# Patient Record
Sex: Female | Born: 1945 | Race: White | Hispanic: No | Marital: Married | Smoking: Never smoker
Health system: Southern US, Community
[De-identification: ages and names within clinical notes are randomized; demographics above are authoritative.]

## PROBLEM LIST (undated history)

## (undated) DIAGNOSIS — E785 Hyperlipidemia, unspecified: Secondary | ICD-10-CM

## (undated) DIAGNOSIS — Z923 Personal history of irradiation: Secondary | ICD-10-CM

## (undated) HISTORY — DX: Hyperlipidemia, unspecified: E78.5

---

## 1996-12-28 HISTORY — PX: BREAST EXCISIONAL BIOPSY: SUR124

## 1998-10-25 ENCOUNTER — Other Ambulatory Visit: Admission: RE | Admit: 1998-10-25 | Discharge: 1998-10-25 | Payer: Self-pay | Admitting: Obstetrics and Gynecology

## 1999-10-07 ENCOUNTER — Other Ambulatory Visit: Admission: RE | Admit: 1999-10-07 | Discharge: 1999-10-07 | Payer: Self-pay | Admitting: Obstetrics and Gynecology

## 2000-10-18 ENCOUNTER — Other Ambulatory Visit: Admission: RE | Admit: 2000-10-18 | Discharge: 2000-10-18 | Payer: Self-pay | Admitting: Obstetrics and Gynecology

## 2001-07-04 ENCOUNTER — Ambulatory Visit (HOSPITAL_COMMUNITY): Admission: RE | Admit: 2001-07-04 | Discharge: 2001-07-04 | Payer: Self-pay | Admitting: Gastroenterology

## 2001-12-19 ENCOUNTER — Other Ambulatory Visit: Admission: RE | Admit: 2001-12-19 | Discharge: 2001-12-19 | Payer: Self-pay | Admitting: Obstetrics and Gynecology

## 2003-05-24 ENCOUNTER — Other Ambulatory Visit: Admission: RE | Admit: 2003-05-24 | Discharge: 2003-05-24 | Payer: Self-pay | Admitting: Obstetrics and Gynecology

## 2004-09-16 ENCOUNTER — Encounter: Admission: RE | Admit: 2004-09-16 | Discharge: 2004-09-16 | Payer: Self-pay | Admitting: Family Medicine

## 2004-12-28 DIAGNOSIS — Z923 Personal history of irradiation: Secondary | ICD-10-CM

## 2004-12-28 HISTORY — DX: Personal history of irradiation: Z92.3

## 2005-09-25 ENCOUNTER — Other Ambulatory Visit: Admission: RE | Admit: 2005-09-25 | Discharge: 2005-09-25 | Payer: Self-pay | Admitting: Family Medicine

## 2005-10-16 ENCOUNTER — Encounter: Admission: RE | Admit: 2005-10-16 | Discharge: 2005-10-16 | Payer: Self-pay | Admitting: General Surgery

## 2005-10-16 ENCOUNTER — Encounter (INDEPENDENT_AMBULATORY_CARE_PROVIDER_SITE_OTHER): Payer: Self-pay | Admitting: Specialist

## 2005-10-16 ENCOUNTER — Encounter (INDEPENDENT_AMBULATORY_CARE_PROVIDER_SITE_OTHER): Payer: Self-pay | Admitting: Diagnostic Radiology

## 2005-10-27 ENCOUNTER — Encounter: Admission: RE | Admit: 2005-10-27 | Discharge: 2005-10-27 | Payer: Self-pay | Admitting: General Surgery

## 2005-10-28 HISTORY — PX: BREAST LUMPECTOMY: SHX2

## 2005-10-29 ENCOUNTER — Ambulatory Visit (HOSPITAL_BASED_OUTPATIENT_CLINIC_OR_DEPARTMENT_OTHER): Admission: RE | Admit: 2005-10-29 | Discharge: 2005-10-29 | Payer: Self-pay | Admitting: General Surgery

## 2005-10-29 ENCOUNTER — Encounter: Admission: RE | Admit: 2005-10-29 | Discharge: 2005-10-29 | Payer: Self-pay | Admitting: General Surgery

## 2005-10-29 ENCOUNTER — Ambulatory Visit (HOSPITAL_COMMUNITY): Admission: RE | Admit: 2005-10-29 | Discharge: 2005-10-29 | Payer: Self-pay | Admitting: General Surgery

## 2005-10-29 ENCOUNTER — Encounter (INDEPENDENT_AMBULATORY_CARE_PROVIDER_SITE_OTHER): Payer: Self-pay | Admitting: Specialist

## 2005-11-03 ENCOUNTER — Ambulatory Visit: Payer: Self-pay | Admitting: Oncology

## 2005-11-04 ENCOUNTER — Ambulatory Visit: Admission: RE | Admit: 2005-11-04 | Discharge: 2006-01-13 | Payer: Self-pay | Admitting: *Deleted

## 2005-12-25 ENCOUNTER — Ambulatory Visit: Payer: Self-pay | Admitting: Oncology

## 2006-05-05 ENCOUNTER — Ambulatory Visit: Payer: Self-pay | Admitting: Oncology

## 2006-05-06 LAB — CBC WITH DIFFERENTIAL/PLATELET
Basophils Absolute: 0 10*3/uL (ref 0.0–0.1)
EOS%: 4.6 % (ref 0.0–7.0)
Eosinophils Absolute: 0.3 10*3/uL (ref 0.0–0.5)
HGB: 13.2 g/dL (ref 11.6–15.9)
LYMPH%: 17.9 % (ref 14.0–48.0)
MCH: 32.5 pg (ref 26.0–34.0)
MCV: 96 fL (ref 81.0–101.0)
MONO%: 11.1 % (ref 0.0–13.0)
NEUT#: 3.7 10*3/uL (ref 1.5–6.5)
NEUT%: 65.9 % (ref 39.6–76.8)
Platelets: 236 10*3/uL (ref 145–400)
RDW: 14.2 % (ref 11.3–14.5)

## 2006-05-06 LAB — COMPREHENSIVE METABOLIC PANEL
AST: 22 U/L (ref 0–37)
Albumin: 4 g/dL (ref 3.5–5.2)
Alkaline Phosphatase: 55 U/L (ref 39–117)
BUN: 15 mg/dL (ref 6–23)
Creatinine, Ser: 1 mg/dL (ref 0.4–1.2)
Glucose, Bld: 113 mg/dL — ABNORMAL HIGH (ref 70–99)
Total Bilirubin: 0.4 mg/dL (ref 0.3–1.2)

## 2006-10-25 ENCOUNTER — Encounter: Admission: RE | Admit: 2006-10-25 | Discharge: 2006-10-25 | Payer: Self-pay | Admitting: Oncology

## 2006-11-12 ENCOUNTER — Ambulatory Visit: Payer: Self-pay | Admitting: Oncology

## 2006-11-16 LAB — CBC WITH DIFFERENTIAL/PLATELET
Basophils Absolute: 0 10*3/uL (ref 0.0–0.1)
HCT: 38.1 % (ref 34.8–46.6)
HGB: 12.9 g/dL (ref 11.6–15.9)
MONO#: 0.5 10*3/uL (ref 0.1–0.9)
NEUT%: 72.6 % (ref 39.6–76.8)
WBC: 7.3 10*3/uL (ref 3.9–10.0)
lymph#: 1.2 10*3/uL (ref 0.9–3.3)

## 2006-11-16 LAB — COMPREHENSIVE METABOLIC PANEL
ALT: 20 U/L (ref 0–35)
AST: 26 U/L (ref 0–37)
Alkaline Phosphatase: 50 U/L (ref 39–117)
BUN: 15 mg/dL (ref 6–23)
Calcium: 9.3 mg/dL (ref 8.4–10.5)
Chloride: 104 mEq/L (ref 96–112)
Creatinine, Ser: 1.07 mg/dL (ref 0.40–1.20)
Potassium: 4.1 mEq/L (ref 3.5–5.3)

## 2007-05-20 ENCOUNTER — Ambulatory Visit: Payer: Self-pay | Admitting: Oncology

## 2007-05-25 LAB — CBC WITH DIFFERENTIAL/PLATELET
BASO%: 0.4 % (ref 0.0–2.0)
Eosinophils Absolute: 0.4 10*3/uL (ref 0.0–0.5)
MCHC: 34.4 g/dL (ref 32.0–36.0)
MONO#: 0.6 10*3/uL (ref 0.1–0.9)
NEUT#: 4.3 10*3/uL (ref 1.5–6.5)
Platelets: 246 10*3/uL (ref 145–400)
RBC: 4 10*6/uL (ref 3.70–5.32)
RDW: 13.1 % (ref 11.3–14.5)
WBC: 6.8 10*3/uL (ref 3.9–10.0)
lymph#: 1.5 10*3/uL (ref 0.9–3.3)

## 2007-05-25 LAB — COMPREHENSIVE METABOLIC PANEL
ALT: 16 U/L (ref 0–35)
Albumin: 4.5 g/dL (ref 3.5–5.2)
CO2: 27 mEq/L (ref 19–32)
Calcium: 8.9 mg/dL (ref 8.4–10.5)
Chloride: 105 mEq/L (ref 96–112)
Glucose, Bld: 86 mg/dL (ref 70–99)
Potassium: 4.3 mEq/L (ref 3.5–5.3)
Sodium: 142 mEq/L (ref 135–145)
Total Bilirubin: 0.3 mg/dL (ref 0.3–1.2)
Total Protein: 6.8 g/dL (ref 6.0–8.3)

## 2007-05-25 LAB — LACTATE DEHYDROGENASE: LDH: 141 U/L (ref 94–250)

## 2007-10-28 ENCOUNTER — Other Ambulatory Visit: Admission: RE | Admit: 2007-10-28 | Discharge: 2007-10-28 | Payer: Self-pay | Admitting: Family Medicine

## 2007-10-31 ENCOUNTER — Encounter: Admission: RE | Admit: 2007-10-31 | Discharge: 2007-10-31 | Payer: Self-pay | Admitting: Oncology

## 2007-11-17 ENCOUNTER — Ambulatory Visit: Payer: Self-pay | Admitting: Oncology

## 2007-11-21 LAB — CBC WITH DIFFERENTIAL/PLATELET
BASO%: 1.2 % (ref 0.0–2.0)
EOS%: 4.2 % (ref 0.0–7.0)
HCT: 38.6 % (ref 34.8–46.6)
MCH: 33.4 pg (ref 26.0–34.0)
MCHC: 34.9 g/dL (ref 32.0–36.0)
NEUT%: 63.2 % (ref 39.6–76.8)
RBC: 4.04 10*6/uL (ref 3.70–5.32)
WBC: 5.8 10*3/uL (ref 3.9–10.0)
lymph#: 1.2 10*3/uL (ref 0.9–3.3)

## 2007-11-21 LAB — COMPREHENSIVE METABOLIC PANEL
ALT: 20 U/L (ref 0–35)
AST: 27 U/L (ref 0–37)
Chloride: 106 mEq/L (ref 96–112)
Creatinine, Ser: 0.94 mg/dL (ref 0.40–1.20)
Sodium: 142 mEq/L (ref 135–145)
Total Bilirubin: 0.4 mg/dL (ref 0.3–1.2)
Total Protein: 7 g/dL (ref 6.0–8.3)

## 2007-11-23 ENCOUNTER — Encounter: Admission: RE | Admit: 2007-11-23 | Discharge: 2007-11-23 | Payer: Self-pay | Admitting: Oncology

## 2008-10-16 HISTORY — PX: BREAST BIOPSY: SHX20

## 2008-10-31 ENCOUNTER — Encounter: Admission: RE | Admit: 2008-10-31 | Discharge: 2008-10-31 | Payer: Self-pay | Admitting: Oncology

## 2008-11-27 ENCOUNTER — Ambulatory Visit: Payer: Self-pay | Admitting: Oncology

## 2008-11-29 LAB — CBC WITH DIFFERENTIAL/PLATELET
Basophils Absolute: 0 10*3/uL (ref 0.0–0.1)
EOS%: 2.4 % (ref 0.0–7.0)
HGB: 13.8 g/dL (ref 11.6–15.9)
MCH: 33 pg (ref 26.0–34.0)
MCHC: 34 g/dL (ref 32.0–36.0)
MCV: 97.1 fL (ref 81.0–101.0)
MONO%: 7.6 % (ref 0.0–13.0)
RDW: 13.4 % (ref 11.3–14.5)

## 2008-11-30 LAB — COMPREHENSIVE METABOLIC PANEL
AST: 25 U/L (ref 0–37)
Albumin: 4.5 g/dL (ref 3.5–5.2)
Alkaline Phosphatase: 55 U/L (ref 39–117)
BUN: 17 mg/dL (ref 6–23)
Creatinine, Ser: 0.94 mg/dL (ref 0.40–1.20)
Potassium: 4.1 mEq/L (ref 3.5–5.3)

## 2008-11-30 LAB — VITAMIN D 25 HYDROXY (VIT D DEFICIENCY, FRACTURES): Vit D, 25-Hydroxy: 56 ng/mL (ref 30–89)

## 2009-02-27 ENCOUNTER — Ambulatory Visit: Payer: Self-pay | Admitting: Sports Medicine

## 2009-02-27 DIAGNOSIS — M25559 Pain in unspecified hip: Secondary | ICD-10-CM | POA: Insufficient documentation

## 2009-02-27 DIAGNOSIS — Q742 Other congenital malformations of lower limb(s), including pelvic girdle: Secondary | ICD-10-CM | POA: Insufficient documentation

## 2009-09-11 ENCOUNTER — Encounter: Admission: RE | Admit: 2009-09-11 | Discharge: 2009-09-11 | Payer: Self-pay | Admitting: Gastroenterology

## 2009-11-01 ENCOUNTER — Encounter: Admission: RE | Admit: 2009-11-01 | Discharge: 2009-11-01 | Payer: Self-pay | Admitting: Oncology

## 2009-11-04 ENCOUNTER — Ambulatory Visit: Payer: Self-pay | Admitting: Vascular Surgery

## 2009-11-19 ENCOUNTER — Ambulatory Visit: Payer: Self-pay | Admitting: Oncology

## 2009-11-25 LAB — CBC WITH DIFFERENTIAL/PLATELET
Basophils Absolute: 0 10*3/uL (ref 0.0–0.1)
Eosinophils Absolute: 0.2 10*3/uL (ref 0.0–0.5)
HCT: 39.3 % (ref 34.8–46.6)
LYMPH%: 21.2 % (ref 14.0–49.7)
MCV: 98.3 fL (ref 79.5–101.0)
MONO%: 8.2 % (ref 0.0–14.0)
NEUT#: 4.4 10*3/uL (ref 1.5–6.5)
NEUT%: 67.2 % (ref 38.4–76.8)
Platelets: 223 10*3/uL (ref 145–400)
RBC: 3.99 10*6/uL (ref 3.70–5.45)

## 2009-11-26 LAB — COMPREHENSIVE METABOLIC PANEL
Alkaline Phosphatase: 58 U/L (ref 39–117)
BUN: 23 mg/dL (ref 6–23)
Creatinine, Ser: 1.06 mg/dL (ref 0.40–1.20)
Glucose, Bld: 106 mg/dL — ABNORMAL HIGH (ref 70–99)
Sodium: 141 mEq/L (ref 135–145)
Total Bilirubin: 0.3 mg/dL (ref 0.3–1.2)

## 2009-11-26 LAB — VITAMIN D 25 HYDROXY (VIT D DEFICIENCY, FRACTURES): Vit D, 25-Hydroxy: 46 ng/mL (ref 30–89)

## 2009-11-26 LAB — LACTATE DEHYDROGENASE: LDH: 130 U/L (ref 94–250)

## 2009-11-28 ENCOUNTER — Ambulatory Visit: Payer: Self-pay | Admitting: Vascular Surgery

## 2010-11-24 ENCOUNTER — Ambulatory Visit: Payer: Self-pay | Admitting: Oncology

## 2010-11-27 ENCOUNTER — Encounter: Admission: RE | Admit: 2010-11-27 | Discharge: 2010-11-27 | Payer: Self-pay | Admitting: Family Medicine

## 2011-02-13 ENCOUNTER — Emergency Department (INDEPENDENT_AMBULATORY_CARE_PROVIDER_SITE_OTHER): Payer: BC Managed Care – PPO

## 2011-02-13 ENCOUNTER — Emergency Department (HOSPITAL_BASED_OUTPATIENT_CLINIC_OR_DEPARTMENT_OTHER)
Admission: EM | Admit: 2011-02-13 | Discharge: 2011-02-13 | Disposition: A | Discharge status: Home / Self Care | Payer: BC Managed Care – PPO | Attending: Emergency Medicine | Admitting: Emergency Medicine

## 2011-02-13 DIAGNOSIS — R109 Unspecified abdominal pain: Secondary | ICD-10-CM

## 2011-02-13 DIAGNOSIS — Z853 Personal history of malignant neoplasm of breast: Secondary | ICD-10-CM | POA: Insufficient documentation

## 2011-02-13 DIAGNOSIS — N83209 Unspecified ovarian cyst, unspecified side: Secondary | ICD-10-CM | POA: Insufficient documentation

## 2011-02-13 DIAGNOSIS — K5732 Diverticulitis of large intestine without perforation or abscess without bleeding: Secondary | ICD-10-CM | POA: Insufficient documentation

## 2011-02-13 DIAGNOSIS — K5900 Constipation, unspecified: Secondary | ICD-10-CM | POA: Insufficient documentation

## 2011-02-13 DIAGNOSIS — N9489 Other specified conditions associated with female genital organs and menstrual cycle: Secondary | ICD-10-CM

## 2011-02-13 LAB — COMPREHENSIVE METABOLIC PANEL
AST: 34 U/L (ref 0–37)
Albumin: 3.9 g/dL (ref 3.5–5.2)
Alkaline Phosphatase: 90 U/L (ref 39–117)
BUN: 10 mg/dL (ref 6–23)
CO2: 27 mEq/L (ref 19–32)
Calcium: 8.8 mg/dL (ref 8.4–10.5)
Chloride: 96 mEq/L (ref 96–112)
Creatinine, Ser: 0.7 mg/dL (ref 0.4–1.2)
GFR calc Af Amer: >60 mL/min (ref >60)
GFR calc non Af Amer: >60 mL/min (ref >60)
Glucose, Bld: 103 mg/dL — ABNORMAL HIGH (ref 70–99)
Potassium: 4.5 mEq/L (ref 3.5–5.1)
Sodium: 132 mEq/L — ABNORMAL LOW (ref 135–145)
Total Bilirubin: 1 mg/dL (ref 0.3–1.2)
Total Protein: 7 g/dL (ref 6.0–8.3)

## 2011-02-13 LAB — CBC
HCT: 39.6 % (ref 36.0–46.0)
MCV: 93 fL (ref 78.0–100.0)
Platelets: 216 10*3/uL (ref 150–400)
RBC: 4.26 MIL/uL (ref 3.87–5.11)
RDW: 12.9 % (ref 11.5–15.5)
WBC: 13.1 10*3/uL — ABNORMAL HIGH (ref 4.0–10.5)

## 2011-02-13 LAB — URINALYSIS, ROUTINE W REFLEX MICROSCOPIC
Hgb urine dipstick: NEGATIVE
Ketones, ur: 15 mg/dL — AB
Nitrite: NEGATIVE
Protein, ur: NEGATIVE mg/dL
Specific Gravity, Urine: 1.007 (ref 1.005–1.030)
Urine Glucose, Fasting: NEGATIVE mg/dL
Urobilinogen, UA: 0.2 mg/dL (ref 0.0–1.0)
pH: 7 (ref 5.0–8.0)

## 2011-02-13 LAB — DIFFERENTIAL
Basophils Absolute: 0 10*3/uL (ref 0.0–0.1)
Basophils Relative: 0 % (ref 0–1)
Eosinophils Absolute: 0.1 10*3/uL (ref 0.0–0.7)
Eosinophils Relative: 1 % (ref 0–5)
Lymphocytes Relative: 9 % — ABNORMAL LOW (ref 12–46)
Monocytes Absolute: 1.2 10*3/uL — ABNORMAL HIGH (ref 0.1–1.0)
Neutro Abs: 10.6 10*3/uL — ABNORMAL HIGH (ref 1.7–7.7)
Neutrophils Relative %: 81 % — ABNORMAL HIGH (ref 43–77)

## 2011-02-13 LAB — URINE MICROSCOPIC-ADD ON

## 2011-02-13 MED ORDER — IOHEXOL 300 MG/ML  SOLN
100.0000 mL | Freq: Once | INTRAMUSCULAR | Status: AC | PRN
  Administered 2011-02-13: 100 mL via INTRAVENOUS

## 2011-03-11 ENCOUNTER — Encounter (HOSPITAL_COMMUNITY)
Admission: RE | Admit: 2011-03-11 | Discharge: 2011-03-11 | Disposition: A | Discharge status: Home / Self Care | Payer: BC Managed Care – PPO | Source: Ambulatory Visit | Attending: Obstetrics & Gynecology | Admitting: Obstetrics & Gynecology

## 2011-03-11 LAB — CBC
Hemoglobin: 14 g/dL (ref 12.0–15.0)
MCH: 31.6 pg (ref 26.0–34.0)
MCV: 98.4 fL (ref 78.0–100.0)
Platelets: 206 10*3/uL (ref 150–400)
RBC: 4.43 MIL/uL (ref 3.87–5.11)
RDW: 13.4 % (ref 11.5–15.5)
WBC: 7.7 10*3/uL (ref 4.0–10.5)

## 2011-03-11 LAB — SURGICAL PCR SCREEN: Staphylococcus aureus: NEGATIVE

## 2011-03-16 ENCOUNTER — Other Ambulatory Visit: Payer: Self-pay | Admitting: Obstetrics & Gynecology

## 2011-03-16 ENCOUNTER — Ambulatory Visit (HOSPITAL_COMMUNITY)
Admission: RE | Admit: 2011-03-16 | Discharge: 2011-03-16 | Disposition: A | Discharge status: Home / Self Care | Payer: BC Managed Care – PPO | Source: Ambulatory Visit | Attending: Obstetrics & Gynecology | Admitting: Obstetrics & Gynecology

## 2011-03-16 DIAGNOSIS — N838 Other noninflammatory disorders of ovary, fallopian tube and broad ligament: Secondary | ICD-10-CM | POA: Insufficient documentation

## 2011-03-16 DIAGNOSIS — N9489 Other specified conditions associated with female genital organs and menstrual cycle: Secondary | ICD-10-CM | POA: Insufficient documentation

## 2011-03-16 DIAGNOSIS — D279 Benign neoplasm of unspecified ovary: Secondary | ICD-10-CM | POA: Insufficient documentation

## 2011-03-16 DIAGNOSIS — E785 Hyperlipidemia, unspecified: Secondary | ICD-10-CM | POA: Insufficient documentation

## 2011-03-16 DIAGNOSIS — Z853 Personal history of malignant neoplasm of breast: Secondary | ICD-10-CM | POA: Insufficient documentation

## 2011-03-16 DIAGNOSIS — Z01818 Encounter for other preprocedural examination: Secondary | ICD-10-CM | POA: Insufficient documentation

## 2011-03-16 DIAGNOSIS — Z01812 Encounter for preprocedural laboratory examination: Secondary | ICD-10-CM | POA: Insufficient documentation

## 2011-03-20 NOTE — Op Note (Signed)
NAME:  Breanna Williams, Breanna Williams NO.:  [id number]  MEDICAL RECORD NO.:  [id number]         PATIENT TYPE:  WAMB  LOCATION:                                FACILITY:  [facility]  PHYSICIAN:  M. Leda Quail, MD  DATE OF BIRTH:  03-12-1946  DATE OF PROCEDURE: DATE OF DISCHARGE:                              OPERATIVE REPORT   PREOPERATIVE DIAGNOSES: 58. A 65 year old, G3, P2, married white female with 12-cm left adnexal     mass. 2. History of diverticulitis in February 2012. 3. CT obtained at the time of the diverticulitis with an 11-cm cystic     lesion noted. 4. Hyperlipidemia. 5. History of left breast cancer, 2007, treated with lumpectomy and     radiation but no chemotherapy.  POSTOPERATIVE DIAGNOSES: 41. A 65 year old, G3, P2, married white female with 12-cm left adnexal     mass. 2. 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 3. History of diverticulitis in February 2012. 4. CT obtained at the time of the diverticulitis with an 11-cm cystic     lesion noted. 5. Hyperlipidemia. 6. History of left breast cancer, 2007, treated with lumpectomy and     radiation but no chemotherapy.  PROCEDURE:  Laparoscopic left salpingo-oophorectomy, pelvic/peritoneal washings.  SURGEON:  M. Leda Quail, MD  ASSISTANT:  Edwena Felty. Romine, MD  ANESTHESIA:  General endotracheal, Dr. Arby Barrette oversaw the case.  FINDINGS:  Smooth large cyst which elevates out of the pelvis without any adhesions, filled with clear fluid, normal-appearing right tube and ovary, normal-appearing left tube, normal-appearing stomach edge and liver edge.  No peritoneal studding or other abnormal findings.  SPECIMENS: 1. Left tube and ovary. 2. Pelvic washings. 3. Cyst fluid. All sent to Pathology.  ESTIMATED BLOOD LOSS:  Minimal.  FLUIDS:  1800 mL of LR.  URINE OUTPUT:  100 mL.  COMPLICATIONS:  None.  INDICATIONS:  Ms. Totton is a very nice 65 year old G3 P2 married white female who came to me in referral  from Dr. Gerri Spore.  She had a bout of diverticulitis in February and had a CT scan done which diagnosed the diverticulitis but also an unknown 11-cm pelvic mass.  It did not have any complex features on the CT scan.  When I saw her in the office, I did a pelvic exam and I could feel 8- to 10-cm smooth and mobile left pelvic mass.  I did have her undergo an ultrasound as well which showed basically an 11.7 x 9 x 7.9 cm echo-free smoothly marginated cyst without any septations, papillations, or excoriations present.  There was no pelvic or abdominal ascites and a CA-125 was normal.  Because of this, I felt that this was a benign finding.  I did counsel the patient about bilateral removal of ovaries, but she is pretty adamant that if I was not concerned about malignancy, then she would prefer only to have her left ovary removed.  Risks and benefits have been discussed with the patient, this was all documented in our office chart.  She presents for procedure today.  PROCEDURE:  The patient is taken to operating room.  She is placed in the  supine position.  General anesthesia was administered by the anesthesia staff without difficulty.  Legs were positioned in the low lithotomy position in [place] stirrups.  Arms were tucked at the sides bilaterally.  Abdomen was prepped with some ChloraPrep.  The inner thighs and vagina were prepped with Betadine.  The patient was draped in normal sterile fashion and a Foley catheter was inserted into the bladder under sterile conditions.  An exam under anesthesia was performed.  Legs were lifted to the high lithotomy position.  Then a bivalve speculum was placed in the vagina.  A single toothed tenaculum was applied to the anterior lip of the cervix.  The cervix was dilated with a Milex dilator and a Hulka clamp was  passed through the cervix and attached to the antior lip as a means of maniupulating the uterus during the procedure.  The single toothed  tenaculum and speculum were removed at this time.  Attention was turned towards the abdomen.  I decided to place the  midline port above the umbilicus for good visualization once the cyst was hopefully elevated out of the pelvis.  A 0.25% Marcaine was used to anesthetize the skin about 2 inches above the umbilicus.  The skin was incised with an #11 blade. Subcutaneous fat tissue was dissected.  A Veress needle was obtained. The abdomen was elevated, the Veress needle was aimed toward the pelvis. Once peritoneum was popped through a syringe of normal saline was obtained.  This was attached to the Veress needle which was opened when it was placed through the peritoneum.  An aspiration was performed, no fluid or blood was noted.  Fluid injected easily through the Veress needle and an aspiration again revealed no fluid, blood, or saline. Fluid dripped easily into the Veress needle and the CO2 gas was attached to the Veress needle under low flow.  Three liters of CO2 gas was instilled in the abdomen before the Veress needle was removed.  Then, another #11 nonbladed trocar with an Optiview port was obtained.  This was passed through the abdominal cavity with direct visualization with the laparoscope.  No complications were encountered.  The trocar was removed.  The laparoscope was placed into the port.  The patient was placed in some Trendelenburg positioning at this point.  The uterus was then elevated, the pelvis was surveyed, and the cyst on the left side did appear quite smooth without any abnormal findings.  Port placement sites were chosen for the right and left lower quadrants.  The skin was anesthetized with 0.25% Marcaine and nicked with #11 blade. A 10-mm port was placed in the right lower quadrant, this was a nonbladed trocar and port, and then a 5 mm in the left lower quadrant. The trocars were removed.  A blunt probe and a Cobra grasper were used to lift the left ovary up out of the  pelvis so that the ureter could be visualized and tissue behind the ovary could be visualized.  This was smooth and there were no adhesions noted.  The ureter was noted well below the level of the IP ligament.  The right tube and ovary were visualized as well as right ureter and the upper abdomen was surveyed with a normal-appearing liver edge, falciform ligament, and stomach edge.  Pelvic washings were obtained.  A gyrus was obtained.  With bipolar cautery, the IP ligament was serially clamped, cauterized, and incised.  Then, the mesosalpinx was clamped, cauterized, and incised as it was quite stretched.  Then,  the utero-ovarian pedicle was clamped, cauterized, and incised.  This freed the ovary and the tube from the sidewall and from the uterus.  There was no bleeding noted at the end of pedicle sites.  An incision was made in the cyst, and the fluid was drained out of the cyst using a Nezhat suction irrigator.  The ovary was then incised to make a little center and this was brought out of the right lower quadrant port.  This tube and ovary were handed off for pathology.  Peritoneal washings were performed before any of the procedure involving the gyrus or incisions and the cyst fluid was sent separately as well.  At this point, the pelvis was irrigated, no bleeding was noted.  The ureter was noted well below the level of the dissection on left side and was peristalsing normally.  The patient was placed back and taken out of the Trendelenburg positioning.  The right and left lower quadrant ports were removed under direct visualization of the laparoscope.  The scope was removed from the midline, and the abdomen was decompressed.  The CRNA gave the patient three good deep breaths, and CO2 gas was allowed to escape out of the midline port.  Then the midline port was removed.  The midline and the right lower quadrant incisions were closed at the fascial level with figure-of-eight suture  with #0 Vicryl.  The skin was closed then with a subcuticular stitch of each incision site of #3-0 Vicryl.  Skin was cleansed and Dermabond was applied.  The vaginal instruments were removed.  There was a small amount of bleeding from the anterior lip of the cervix where the Hulka clamp was applied to the cervix.  This was made hemostatic with silver nitrate.  The skin prep was cleansed off and was removed off the skin.  Sponge, lap, needle, instrument counts were correct x2.  The patient's legs were positioned back in the supine position and were watched very carefully during this portion of the procedure.  She was awakened from anesthesia and extubated without difficulty.  The patient was taken to the recovery room in stable condition.  PATHOLOGY SPECIMENS:  Left tube and ovary, pelvic washings, and cyst fluid.     Lum Keas, MD     MSM/MEDQ  D:  03/16/2011  T:  03/17/2011  Job:  [id number]  cc:   Otilio Connors. Gerri Spore, M.D. Fax: [phone]  Electronically Signed by Paul Half MD on 03/20/2011 08:43:04 AM

## 2011-05-12 NOTE — Consult Note (Signed)
NEW PATIENT CONSULTATION   Breanna Williams, Breanna Williams  DOB:  07-06-1946                                       11/04/2009  WUJWJ#:[id number]   The patient is a 65 year old healthy female self-referred for evaluation  for sclerotherapy for multiple spider veins in both lower extremities.  This patient has no history of deep venous thrombosis, thrombophlebitis,  pulmonary emboli, stasis ulcers, bleeding or distal edema.  She has had  a localized vein stripping of her left lower leg below the knee about 20  years ago.  She was evaluated at [facility] 13 months ago for  possible treatment but never had any treatment performed.  She did have  ultrasounds performed at that time but does not have the results of  those.  She has elastic compression stockings but that does not wear  them because she is asymptomatic.   PAST MEDICAL HISTORY:  Negative for coronary artery disease, diabetes  and stroke.   SOCIAL HISTORY:  She is married, has 2 children.  Does not use tobacco.   REVIEW OF SYSTEMS:  Denies chest pain.  No pulmonary problems.  Does  have back discomfort.  All other systems are negative.   PHYSICAL EXAMINATION:  Blood pressure 118/70, heart rate 68,  respirations 14.  General:  She is a healthy-appearing, middle-aged  female in no apparent stress, alert and oriented x3.  Neck:  Supple, 3+  carotid pulses palpable.  No bruits are audible.  Chest:  Clear to  auscultation.  Cardiovascular:  Regular rhythm.  No murmurs.  Abdomen:  Soft, nontender with no masses.  Both legs have femoral popliteal and  dorsalis pedis pulses palpable.  The right leg has a prominent pretibial  vein extending from below the patella down to the ankle.  There is no  hyperpigmentation or ulceration or distal edema noted.  There are  patches of spider veins in both the mid thigh areas but no bulging  varicosities are noted in either leg.   She does appear to be a good candidate for  sclerotherapy in both lower  extremities, and we will discuss that further with her and if she  decides to proceed, she will schedule that in the near future.  No  ultrasounds or other evaluation is necessary at this time.   Quita Skye Hart [place], M.D.  Electronically Signed   JDL/MEDQ  D:  11/04/2009  T:  11/05/2009  Job:  [id number]

## 2011-05-15 NOTE — Procedures (Signed)
[place]  Patient:    Breanna Williams, Breanna Williams                         MRN: [id number] Proc. Date: 07/04/01 Adm. Date:  [id number] Attending:  Nelda Marseille CC:         Carola J. Gerri Spore, M.D.  Laqueta Linden, M.D.   Procedure Report  PROCEDURE: Colonoscopy.  ENDOSCOPIST: Petra Kuba, M.D.  INDICATIONS FOR PROCEDURE: Change in bowel habits, family history of Crohns disease, due for colonic screening.  Consent was signed after the risks, benefit, methods, and options were thoroughly discussed in the office.  MEDICINES USED: Demerol 70 mg, Versed 7 mg.  DESCRIPTION OF PROCEDURE: Rectal inspection was pertinent for external hemorrhoids.  Digital examination was negative.  The video pediatric colonoscope was inserted and fairly easily advanced around the colon to the cecum, which was identified by the appendiceal orifice and ileocecal valve. On insertion some left-sided diverticula were seen but no other abnormalities. The scope was inserted a short ways in the terminal ileum, which was normal. Photo documentation was obtained.  The scope was slowly withdrawn.  Prep was adequate.  There was some liquid stool that required washing and suction.  On slow withdrawal through the colon the cecum, ascending, and transverse were all normal.  As the scope was withdrawn around the left side of the colon a moderate amount of diverticula were seen with some wall edema, but no signs of Crohns disease.  The scope was slowly withdrawn to the rectum and once back in the rectum the scope was retroflexed, pertinent for some internal hemorrhoids.  The scope was extremed and advanced a short way of the sigmoid. Air was suctioned and the scope removed.  The patient tolerated the procedure well.  There were no immediate obvious complications.  ENDOSCOPIC DIAGNOSES:  1. Internal and external hemorrhoids.  2. Significant left-sided diverticula and wall edema.  3.  Otherwise within normal limits to the terminal ileum, without signs of     Crohns or other abnormalities.  PLAN: Diverticula, reassure.  Increase fiber probably.  Follow up p.r.n. or in two to three months for recheck of symptoms and make sure no further work-up plans are needed. DD:  07/04/01 TD:  07/04/01 Job: [id number]

## 2011-05-15 NOTE — Op Note (Signed)
NAME:  Breanna Williams, SAAR NO.:  [id number]   MEDICAL RECORD NO.:  [id number]          PATIENT TYPE:  AMB   LOCATION:  DSC                          FACILITY:  [facility]   PHYSICIAN:  Gita Kudo, M.D. DATE OF BIRTH:  Aug 08, 1946   DATE OF PROCEDURE:  10/29/2005  DATE OF DISCHARGE:                                 OPERATIVE REPORT   OPERATIVE PROCEDURE:  Left partial mastectomy, left sentinel lymph node  biopsy axilla.   SURGEON:  Gita Kudo, M.D.   ANESTHESIA:  General.   PREOPERATIVE DIAGNOSIS:  Cancer of the left breast.   POSTOPERATIVE DIAGNOSIS:  Cancer of the left breast, negative sentinel  nodes.   CLINICAL SUMMARY:  65 year old female with abnormal mammogram, had  stereotactic biopsy which showed a carcinoma.  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  She comes in now for elective surgery.  She has had MRI  which showed no other disease.   OPERATIVE FINDINGS:  There was one hot blue node in the axilla that was  removed and one smaller benign appearing node that was also removed,  although not hot.  These both came back as negative.  The lesion was well  localized by wire, I went all the way around the wire and felt that I had it  removed nicely, and also x-rays showed that the clip was in the wire in the  center of the specimen.   OPERATIVE PROCEDURE:  Under satisfactory general anesthesia, the patient was  prepped and draped in a standard fashion.  4 mL of methylene blue was  injected subareolarly.  A transverse incision was made in the axilla and  Neoprobe used as well as visualization to find the hot blue node which was  removed after the base was clipped.  The second node was identified and also  removed.  The Neoprobe used in the axilla and there was no evidence of any  activity.    Following this, a radial incision was made centered over the wire and then  cautery used to remove the large amount of breast tissue widely around the  wire and down to the pectoralis muscle.  I did not encounter the wire and,  therefore, felt that I had the lesion well removed.  The wound was then  lavaged with saline.  Marcaine was infiltrated in both areas for a total of  30 mL.  The biopsy site was marked with metal clips.  Then, the axillary  wound was closed in layers with 3-0 Vicryl and staples for the skin.  Likewise, the breast cavity was loosely approximated  when I could with interrupted 3-0 Vicryl and also staples for the skin.  Sterile absorbent dressings were applied.  At this point, the pathologist  has not called back about the margins of the breast to make sure that the  lesion is well excised.  A sterile absorbent dressing will be applied.           ______________________________  Gita Kudo, M.D.     MRL/MEDQ  D:  10/29/2005  T:  10/29/2005  Job:  [id number]   cc:   Otilio Connors. Gerri Spore, M.D.  Fax: [phone]

## 2011-11-10 ENCOUNTER — Other Ambulatory Visit: Payer: Self-pay | Admitting: Family Medicine

## 2011-11-10 DIAGNOSIS — C50919 Malignant neoplasm of unspecified site of unspecified female breast: Secondary | ICD-10-CM

## 2011-12-07 ENCOUNTER — Ambulatory Visit
Admission: RE | Admit: 2011-12-07 | Discharge: 2011-12-07 | Disposition: A | Discharge status: Home / Self Care | Payer: BC Managed Care – PPO | Source: Ambulatory Visit | Attending: Family Medicine | Admitting: Family Medicine

## 2011-12-07 DIAGNOSIS — C50919 Malignant neoplasm of unspecified site of unspecified female breast: Secondary | ICD-10-CM

## 2012-06-07 ENCOUNTER — Other Ambulatory Visit: Payer: Self-pay | Admitting: Family Medicine

## 2012-06-07 ENCOUNTER — Ambulatory Visit
Admission: RE | Admit: 2012-06-07 | Discharge: 2012-06-07 | Disposition: A | Discharge status: Home / Self Care | Payer: Medicare Other | Source: Ambulatory Visit | Attending: Family Medicine | Admitting: Family Medicine

## 2012-06-07 DIAGNOSIS — R52 Pain, unspecified: Secondary | ICD-10-CM

## 2012-08-11 ENCOUNTER — Other Ambulatory Visit: Payer: Self-pay

## 2012-12-13 ENCOUNTER — Other Ambulatory Visit: Payer: Self-pay | Admitting: Family Medicine

## 2012-12-13 DIAGNOSIS — Z853 Personal history of malignant neoplasm of breast: Secondary | ICD-10-CM

## 2013-01-13 ENCOUNTER — Ambulatory Visit
Admission: RE | Admit: 2013-01-13 | Discharge: 2013-01-13 | Disposition: A | Discharge status: Home / Self Care | Payer: Medicare Other | Source: Ambulatory Visit | Attending: Family Medicine | Admitting: Family Medicine

## 2013-01-13 DIAGNOSIS — Z853 Personal history of malignant neoplasm of breast: Secondary | ICD-10-CM

## 2013-11-30 ENCOUNTER — Other Ambulatory Visit: Payer: Self-pay | Admitting: Family Medicine

## 2013-11-30 DIAGNOSIS — Z853 Personal history of malignant neoplasm of breast: Secondary | ICD-10-CM

## 2013-11-30 DIAGNOSIS — Z9889 Other specified postprocedural states: Secondary | ICD-10-CM

## 2013-11-30 DIAGNOSIS — Z1231 Encounter for screening mammogram for malignant neoplasm of breast: Secondary | ICD-10-CM

## 2014-01-15 ENCOUNTER — Ambulatory Visit
Admission: RE | Admit: 2014-01-15 | Discharge: 2014-01-15 | Disposition: A | Discharge status: Home / Self Care | Payer: Medicare Other | Source: Ambulatory Visit | Attending: Family Medicine | Admitting: Family Medicine

## 2014-01-15 DIAGNOSIS — Z9889 Other specified postprocedural states: Secondary | ICD-10-CM

## 2014-01-15 DIAGNOSIS — Z1231 Encounter for screening mammogram for malignant neoplasm of breast: Secondary | ICD-10-CM

## 2014-01-15 DIAGNOSIS — Z853 Personal history of malignant neoplasm of breast: Secondary | ICD-10-CM

## 2015-01-08 ENCOUNTER — Other Ambulatory Visit: Payer: Self-pay

## 2015-01-08 DIAGNOSIS — Z1231 Encounter for screening mammogram for malignant neoplasm of breast: Secondary | ICD-10-CM

## 2015-01-16 ENCOUNTER — Ambulatory Visit: Payer: Self-pay

## 2015-02-05 ENCOUNTER — Encounter (INDEPENDENT_AMBULATORY_CARE_PROVIDER_SITE_OTHER): Payer: Self-pay

## 2015-02-05 ENCOUNTER — Ambulatory Visit
Admission: RE | Admit: 2015-02-05 | Discharge: 2015-02-05 | Disposition: A | Discharge status: Home / Self Care | Payer: Medicare Other | Source: Ambulatory Visit

## 2015-02-05 DIAGNOSIS — Z1231 Encounter for screening mammogram for malignant neoplasm of breast: Secondary | ICD-10-CM

## 2016-01-01 DIAGNOSIS — L570 Actinic keratosis: Secondary | ICD-10-CM | POA: Diagnosis not present

## 2016-01-01 DIAGNOSIS — L821 Other seborrheic keratosis: Secondary | ICD-10-CM | POA: Diagnosis not present

## 2016-01-14 DIAGNOSIS — H401211 Low-tension glaucoma, right eye, mild stage: Secondary | ICD-10-CM | POA: Diagnosis not present

## 2016-01-14 DIAGNOSIS — H40022 Open angle with borderline findings, high risk, left eye: Secondary | ICD-10-CM | POA: Diagnosis not present

## 2016-01-14 DIAGNOSIS — H40023 Open angle with borderline findings, high risk, bilateral: Secondary | ICD-10-CM | POA: Diagnosis not present

## 2016-02-18 ENCOUNTER — Other Ambulatory Visit: Payer: Self-pay

## 2016-02-18 DIAGNOSIS — Z1231 Encounter for screening mammogram for malignant neoplasm of breast: Secondary | ICD-10-CM

## 2016-02-20 ENCOUNTER — Ambulatory Visit
Admission: RE | Admit: 2016-02-20 | Discharge: 2016-02-20 | Disposition: A | Discharge status: Home / Self Care | Payer: PPO | Source: Ambulatory Visit

## 2016-02-20 DIAGNOSIS — Z1231 Encounter for screening mammogram for malignant neoplasm of breast: Secondary | ICD-10-CM

## 2016-03-04 DIAGNOSIS — Z0000 Encounter for general adult medical examination without abnormal findings: Secondary | ICD-10-CM | POA: Diagnosis not present

## 2016-03-04 DIAGNOSIS — E785 Hyperlipidemia, unspecified: Secondary | ICD-10-CM | POA: Diagnosis not present

## 2016-03-18 DIAGNOSIS — M79672 Pain in left foot: Secondary | ICD-10-CM | POA: Diagnosis not present

## 2016-03-18 DIAGNOSIS — M79671 Pain in right foot: Secondary | ICD-10-CM | POA: Diagnosis not present

## 2016-04-24 ENCOUNTER — Ambulatory Visit (INDEPENDENT_AMBULATORY_CARE_PROVIDER_SITE_OTHER): Payer: PPO | Admitting: Podiatry

## 2016-04-24 ENCOUNTER — Encounter: Payer: Self-pay | Admitting: Podiatry

## 2016-04-24 ENCOUNTER — Ambulatory Visit (INDEPENDENT_AMBULATORY_CARE_PROVIDER_SITE_OTHER): Payer: PPO

## 2016-04-24 DIAGNOSIS — M79676 Pain in unspecified toe(s): Secondary | ICD-10-CM

## 2016-04-24 DIAGNOSIS — L84 Corns and callosities: Secondary | ICD-10-CM

## 2016-04-24 DIAGNOSIS — M2040 Other hammer toe(s) (acquired), unspecified foot: Secondary | ICD-10-CM

## 2016-04-24 NOTE — Progress Notes (Signed)
   Subjective:    Patient ID: Breanna Williams, female    DOB: 1946-11-20, 70 y.o.   MRN: [id number]  HPI  Chief Complaint  Patient presents with  . Toe Pain    ''B/L 2ND TOE ARE CURVING AND SORE ON THE TOP.''   70 year old female presents the also concerns of pain to both of her second toes with left side worse than the right. She says they are contracted and putting pressure to the top of the toe. She has tried different pads and offloading devices without much relief. Denies any recent injury or trauma. His been ongoing for greater than 1 year. No other complaints.  Review of Systems  All other systems reviewed and are negative.      Objective:   Physical Exam General: AAO x3, NAD  Dermatological: Hyperkeratotic lesions present left dorsal second toe and lateral right fifth toe. Upon debridement no underlying ulceration, drainage or other signs of infection. No open lesions present.  Vascular: Dorsalis Pedis artery and Posterior Tibial artery pedal pulses are 2/4 bilateral with immedate capillary fill time. Pedal hair growth present. No varicosities and no lower extremity edema present bilateral. There is no pain with calf compression, swelling, warmth, erythema.   Neruologic: Grossly intact via light touch bilateral. Vibratory intact via tuning fork bilateral. Protective threshold with Semmes Wienstein monofilament intact to all pedal sites bilateral. Patellar and Achilles deep tendon reflexes 2+ bilateral. No Babinski or clonus noted bilateral.   Musculoskeletal: There is significant contracture of the second toe bilaterally left side worse than the right and since and frontal plane deformity as well impression hallux. There is tenderness overlying the dorsal PIPJ of the left second toe was in the right side. Assessment tenderness over the hyperkeratotic lesion on the right fifth toe. Adductovarus is present all the digits. Gait: Unassisted, Nonantalgic.      Assessment & Plan:    70 year old female significant hammertoe deformity, longus second metatarsal hyperkeratotic lesion/varus. -Treatment options discussed including all alternatives, risks, and complications -Etiology of symptoms were discussed -X-rays were obtained and reviewed with the patient. Hammertoes are present. No evidence of acute fracture. -I long discussion with the patient requested treatment options. Discussed surgical and conservative treatment. Conservatively discussed a different brace to help hold the toe in rectus position is old offloading pads. Discussed surgical intervention including reconstruction versus a dictation. She sought to proceed with conservative treatment for now. -Hyperkeratotic lesions braided 2 without complications or bleeding. -Follow-up in 6 weeks if symptoms continue or sooner pieces are to arise.

## 2016-04-27 ENCOUNTER — Ambulatory Visit: Payer: PPO | Admitting: Podiatry

## 2016-06-04 DIAGNOSIS — Z780 Asymptomatic menopausal state: Secondary | ICD-10-CM | POA: Diagnosis not present

## 2016-06-04 DIAGNOSIS — M810 Age-related osteoporosis without current pathological fracture: Secondary | ICD-10-CM | POA: Diagnosis not present

## 2016-06-04 DIAGNOSIS — M8589 Other specified disorders of bone density and structure, multiple sites: Secondary | ICD-10-CM | POA: Diagnosis not present

## 2016-10-06 DIAGNOSIS — Z23 Encounter for immunization: Secondary | ICD-10-CM | POA: Diagnosis not present

## 2016-10-06 DIAGNOSIS — K529 Noninfective gastroenteritis and colitis, unspecified: Secondary | ICD-10-CM | POA: Diagnosis not present

## 2016-10-12 DIAGNOSIS — K529 Noninfective gastroenteritis and colitis, unspecified: Secondary | ICD-10-CM | POA: Diagnosis not present

## 2016-10-13 DIAGNOSIS — L0100 Impetigo, unspecified: Secondary | ICD-10-CM | POA: Diagnosis not present

## 2016-12-08 DIAGNOSIS — H401211 Low-tension glaucoma, right eye, mild stage: Secondary | ICD-10-CM | POA: Diagnosis not present

## 2016-12-08 DIAGNOSIS — H25043 Posterior subcapsular polar age-related cataract, bilateral: Secondary | ICD-10-CM | POA: Diagnosis not present

## 2016-12-08 DIAGNOSIS — H35439 Paving stone degeneration of retina, unspecified eye: Secondary | ICD-10-CM | POA: Diagnosis not present

## 2016-12-08 DIAGNOSIS — H40022 Open angle with borderline findings, high risk, left eye: Secondary | ICD-10-CM | POA: Diagnosis not present

## 2017-03-01 ENCOUNTER — Other Ambulatory Visit: Payer: Self-pay | Admitting: Family Medicine

## 2017-03-01 DIAGNOSIS — Z1231 Encounter for screening mammogram for malignant neoplasm of breast: Secondary | ICD-10-CM

## 2017-03-09 DIAGNOSIS — D485 Neoplasm of uncertain behavior of skin: Secondary | ICD-10-CM | POA: Diagnosis not present

## 2017-03-09 DIAGNOSIS — D0439 Carcinoma in situ of skin of other parts of face: Secondary | ICD-10-CM | POA: Diagnosis not present

## 2017-03-09 DIAGNOSIS — D1801 Hemangioma of skin and subcutaneous tissue: Secondary | ICD-10-CM | POA: Diagnosis not present

## 2017-03-09 DIAGNOSIS — D235 Other benign neoplasm of skin of trunk: Secondary | ICD-10-CM | POA: Diagnosis not present

## 2017-03-09 DIAGNOSIS — L821 Other seborrheic keratosis: Secondary | ICD-10-CM | POA: Diagnosis not present

## 2017-03-09 DIAGNOSIS — L570 Actinic keratosis: Secondary | ICD-10-CM | POA: Diagnosis not present

## 2017-03-10 DIAGNOSIS — Z5181 Encounter for therapeutic drug level monitoring: Secondary | ICD-10-CM | POA: Diagnosis not present

## 2017-03-10 DIAGNOSIS — M810 Age-related osteoporosis without current pathological fracture: Secondary | ICD-10-CM | POA: Diagnosis not present

## 2017-03-10 DIAGNOSIS — E785 Hyperlipidemia, unspecified: Secondary | ICD-10-CM | POA: Diagnosis not present

## 2017-03-17 ENCOUNTER — Ambulatory Visit
Admission: RE | Admit: 2017-03-17 | Discharge: 2017-03-17 | Disposition: A | Discharge status: Home / Self Care | Payer: PPO | Source: Ambulatory Visit | Attending: Family Medicine | Admitting: Family Medicine

## 2017-03-17 DIAGNOSIS — Z1231 Encounter for screening mammogram for malignant neoplasm of breast: Secondary | ICD-10-CM | POA: Diagnosis not present

## 2017-04-05 DIAGNOSIS — L570 Actinic keratosis: Secondary | ICD-10-CM | POA: Diagnosis not present

## 2017-04-05 DIAGNOSIS — D0439 Carcinoma in situ of skin of other parts of face: Secondary | ICD-10-CM | POA: Diagnosis not present

## 2017-05-13 DIAGNOSIS — Z85828 Personal history of other malignant neoplasm of skin: Secondary | ICD-10-CM | POA: Diagnosis not present

## 2017-05-13 DIAGNOSIS — L570 Actinic keratosis: Secondary | ICD-10-CM | POA: Diagnosis not present

## 2017-05-13 DIAGNOSIS — L578 Other skin changes due to chronic exposure to nonionizing radiation: Secondary | ICD-10-CM | POA: Diagnosis not present

## 2017-05-31 DIAGNOSIS — H401211 Low-tension glaucoma, right eye, mild stage: Secondary | ICD-10-CM | POA: Diagnosis not present

## 2017-05-31 DIAGNOSIS — H40022 Open angle with borderline findings, high risk, left eye: Secondary | ICD-10-CM | POA: Diagnosis not present

## 2017-12-17 DIAGNOSIS — H40022 Open angle with borderline findings, high risk, left eye: Secondary | ICD-10-CM | POA: Diagnosis not present

## 2017-12-17 DIAGNOSIS — H401211 Low-tension glaucoma, right eye, mild stage: Secondary | ICD-10-CM | POA: Diagnosis not present

## 2017-12-17 DIAGNOSIS — H25013 Cortical age-related cataract, bilateral: Secondary | ICD-10-CM | POA: Diagnosis not present

## 2017-12-17 DIAGNOSIS — H2513 Age-related nuclear cataract, bilateral: Secondary | ICD-10-CM | POA: Diagnosis not present

## 2018-01-11 DIAGNOSIS — Z8719 Personal history of other diseases of the digestive system: Secondary | ICD-10-CM | POA: Diagnosis not present

## 2018-01-11 DIAGNOSIS — Z0000 Encounter for general adult medical examination without abnormal findings: Secondary | ICD-10-CM | POA: Diagnosis not present

## 2018-01-11 DIAGNOSIS — Z1211 Encounter for screening for malignant neoplasm of colon: Secondary | ICD-10-CM | POA: Diagnosis not present

## 2018-01-11 DIAGNOSIS — Z5181 Encounter for therapeutic drug level monitoring: Secondary | ICD-10-CM | POA: Diagnosis not present

## 2018-01-11 DIAGNOSIS — E785 Hyperlipidemia, unspecified: Secondary | ICD-10-CM | POA: Diagnosis not present

## 2018-01-11 DIAGNOSIS — Z136 Encounter for screening for cardiovascular disorders: Secondary | ICD-10-CM | POA: Diagnosis not present

## 2018-01-11 DIAGNOSIS — R399 Unspecified symptoms and signs involving the genitourinary system: Secondary | ICD-10-CM | POA: Diagnosis not present

## 2018-01-13 DIAGNOSIS — Z1211 Encounter for screening for malignant neoplasm of colon: Secondary | ICD-10-CM | POA: Diagnosis not present

## 2018-01-27 DIAGNOSIS — R69 Illness, unspecified: Secondary | ICD-10-CM | POA: Diagnosis not present

## 2018-02-10 DIAGNOSIS — R197 Diarrhea, unspecified: Secondary | ICD-10-CM | POA: Diagnosis not present

## 2018-02-12 DIAGNOSIS — R197 Diarrhea, unspecified: Secondary | ICD-10-CM | POA: Diagnosis not present

## 2018-02-15 DIAGNOSIS — R197 Diarrhea, unspecified: Secondary | ICD-10-CM | POA: Diagnosis not present

## 2018-02-24 DIAGNOSIS — L570 Actinic keratosis: Secondary | ICD-10-CM | POA: Diagnosis not present

## 2018-02-24 DIAGNOSIS — Z85828 Personal history of other malignant neoplasm of skin: Secondary | ICD-10-CM | POA: Diagnosis not present

## 2018-02-24 DIAGNOSIS — L821 Other seborrheic keratosis: Secondary | ICD-10-CM | POA: Diagnosis not present

## 2018-02-24 DIAGNOSIS — L578 Other skin changes due to chronic exposure to nonionizing radiation: Secondary | ICD-10-CM | POA: Diagnosis not present

## 2018-02-24 DIAGNOSIS — D225 Melanocytic nevi of trunk: Secondary | ICD-10-CM | POA: Diagnosis not present

## 2018-02-24 DIAGNOSIS — L814 Other melanin hyperpigmentation: Secondary | ICD-10-CM | POA: Diagnosis not present

## 2018-04-05 ENCOUNTER — Other Ambulatory Visit: Payer: Self-pay | Admitting: Family Medicine

## 2018-04-05 DIAGNOSIS — Z1231 Encounter for screening mammogram for malignant neoplasm of breast: Secondary | ICD-10-CM

## 2018-04-11 DIAGNOSIS — Z1211 Encounter for screening for malignant neoplasm of colon: Secondary | ICD-10-CM | POA: Diagnosis not present

## 2018-04-11 DIAGNOSIS — K529 Noninfective gastroenteritis and colitis, unspecified: Secondary | ICD-10-CM | POA: Diagnosis not present

## 2018-04-28 ENCOUNTER — Ambulatory Visit
Admission: RE | Admit: 2018-04-28 | Discharge: 2018-04-28 | Disposition: A | Discharge status: Home / Self Care | Payer: Medicare HMO | Source: Ambulatory Visit | Attending: Family Medicine | Admitting: Family Medicine

## 2018-04-28 DIAGNOSIS — Z1231 Encounter for screening mammogram for malignant neoplasm of breast: Secondary | ICD-10-CM | POA: Diagnosis not present

## 2018-04-28 HISTORY — DX: Personal history of irradiation: Z92.3

## 2018-06-15 DIAGNOSIS — D485 Neoplasm of uncertain behavior of skin: Secondary | ICD-10-CM | POA: Diagnosis not present

## 2018-07-18 DIAGNOSIS — D126 Benign neoplasm of colon, unspecified: Secondary | ICD-10-CM | POA: Diagnosis not present

## 2018-07-18 DIAGNOSIS — K5730 Diverticulosis of large intestine without perforation or abscess without bleeding: Secondary | ICD-10-CM | POA: Diagnosis not present

## 2018-07-18 DIAGNOSIS — Z1211 Encounter for screening for malignant neoplasm of colon: Secondary | ICD-10-CM | POA: Diagnosis not present

## 2018-07-19 DIAGNOSIS — D126 Benign neoplasm of colon, unspecified: Secondary | ICD-10-CM | POA: Diagnosis not present

## 2018-09-21 DIAGNOSIS — H401211 Low-tension glaucoma, right eye, mild stage: Secondary | ICD-10-CM | POA: Diagnosis not present

## 2018-09-21 DIAGNOSIS — H40022 Open angle with borderline findings, high risk, left eye: Secondary | ICD-10-CM | POA: Diagnosis not present

## 2019-01-17 DIAGNOSIS — Z5181 Encounter for therapeutic drug level monitoring: Secondary | ICD-10-CM | POA: Diagnosis not present

## 2019-01-17 DIAGNOSIS — Z23 Encounter for immunization: Secondary | ICD-10-CM | POA: Diagnosis not present

## 2019-01-17 DIAGNOSIS — Z0000 Encounter for general adult medical examination without abnormal findings: Secondary | ICD-10-CM | POA: Diagnosis not present

## 2019-01-17 DIAGNOSIS — E785 Hyperlipidemia, unspecified: Secondary | ICD-10-CM | POA: Diagnosis not present

## 2019-01-17 DIAGNOSIS — G4700 Insomnia, unspecified: Secondary | ICD-10-CM | POA: Diagnosis not present

## 2019-01-17 DIAGNOSIS — C50919 Malignant neoplasm of unspecified site of unspecified female breast: Secondary | ICD-10-CM | POA: Diagnosis not present

## 2019-01-17 DIAGNOSIS — K219 Gastro-esophageal reflux disease without esophagitis: Secondary | ICD-10-CM | POA: Diagnosis not present

## 2019-01-17 DIAGNOSIS — M810 Age-related osteoporosis without current pathological fracture: Secondary | ICD-10-CM | POA: Diagnosis not present

## 2019-01-17 DIAGNOSIS — K580 Irritable bowel syndrome with diarrhea: Secondary | ICD-10-CM | POA: Diagnosis not present

## 2019-01-24 DIAGNOSIS — D225 Melanocytic nevi of trunk: Secondary | ICD-10-CM | POA: Diagnosis not present

## 2019-01-24 DIAGNOSIS — L814 Other melanin hyperpigmentation: Secondary | ICD-10-CM | POA: Diagnosis not present

## 2019-01-24 DIAGNOSIS — L821 Other seborrheic keratosis: Secondary | ICD-10-CM | POA: Diagnosis not present

## 2019-01-24 DIAGNOSIS — L570 Actinic keratosis: Secondary | ICD-10-CM | POA: Diagnosis not present

## 2019-01-24 DIAGNOSIS — D0439 Carcinoma in situ of skin of other parts of face: Secondary | ICD-10-CM | POA: Diagnosis not present

## 2019-03-08 DIAGNOSIS — D0439 Carcinoma in situ of skin of other parts of face: Secondary | ICD-10-CM | POA: Diagnosis not present

## 2019-05-30 DIAGNOSIS — R69 Illness, unspecified: Secondary | ICD-10-CM | POA: Diagnosis not present

## 2019-06-05 ENCOUNTER — Other Ambulatory Visit: Payer: Self-pay | Admitting: Family Medicine

## 2019-06-05 DIAGNOSIS — Z1231 Encounter for screening mammogram for malignant neoplasm of breast: Secondary | ICD-10-CM

## 2019-06-19 DIAGNOSIS — Z85828 Personal history of other malignant neoplasm of skin: Secondary | ICD-10-CM | POA: Diagnosis not present

## 2019-06-19 DIAGNOSIS — L570 Actinic keratosis: Secondary | ICD-10-CM | POA: Diagnosis not present

## 2019-06-19 DIAGNOSIS — L905 Scar conditions and fibrosis of skin: Secondary | ICD-10-CM | POA: Diagnosis not present

## 2019-06-19 DIAGNOSIS — C44329 Squamous cell carcinoma of skin of other parts of face: Secondary | ICD-10-CM | POA: Diagnosis not present

## 2019-07-21 ENCOUNTER — Ambulatory Visit
Admission: RE | Admit: 2019-07-21 | Discharge: 2019-07-21 | Disposition: A | Discharge status: Home / Self Care | Payer: Medicare HMO | Source: Ambulatory Visit | Attending: Family Medicine | Admitting: Family Medicine

## 2019-07-21 ENCOUNTER — Other Ambulatory Visit: Payer: Self-pay

## 2019-07-21 DIAGNOSIS — Z1231 Encounter for screening mammogram for malignant neoplasm of breast: Secondary | ICD-10-CM | POA: Diagnosis not present

## 2019-09-22 DIAGNOSIS — R69 Illness, unspecified: Secondary | ICD-10-CM | POA: Diagnosis not present

## 2019-10-23 DIAGNOSIS — C44329 Squamous cell carcinoma of skin of other parts of face: Secondary | ICD-10-CM | POA: Diagnosis not present

## 2019-10-25 DIAGNOSIS — H35361 Drusen (degenerative) of macula, right eye: Secondary | ICD-10-CM | POA: Diagnosis not present

## 2019-10-25 DIAGNOSIS — H43812 Vitreous degeneration, left eye: Secondary | ICD-10-CM | POA: Diagnosis not present

## 2019-10-25 DIAGNOSIS — H401211 Low-tension glaucoma, right eye, mild stage: Secondary | ICD-10-CM | POA: Diagnosis not present

## 2019-10-25 DIAGNOSIS — H40022 Open angle with borderline findings, high risk, left eye: Secondary | ICD-10-CM | POA: Diagnosis not present

## 2019-11-08 DIAGNOSIS — R69 Illness, unspecified: Secondary | ICD-10-CM | POA: Diagnosis not present

## 2019-11-14 DIAGNOSIS — Z20828 Contact with and (suspected) exposure to other viral communicable diseases: Secondary | ICD-10-CM | POA: Diagnosis not present

## 2019-11-28 DIAGNOSIS — R05 Cough: Secondary | ICD-10-CM | POA: Diagnosis not present

## 2020-02-13 ENCOUNTER — Ambulatory Visit: Payer: Medicare HMO

## 2020-03-21 DIAGNOSIS — K529 Noninfective gastroenteritis and colitis, unspecified: Secondary | ICD-10-CM | POA: Diagnosis not present

## 2020-03-21 DIAGNOSIS — M810 Age-related osteoporosis without current pathological fracture: Secondary | ICD-10-CM | POA: Diagnosis not present

## 2020-03-21 DIAGNOSIS — K580 Irritable bowel syndrome with diarrhea: Secondary | ICD-10-CM | POA: Diagnosis not present

## 2020-04-08 ENCOUNTER — Other Ambulatory Visit: Payer: Self-pay

## 2020-04-08 ENCOUNTER — Ambulatory Visit (INDEPENDENT_AMBULATORY_CARE_PROVIDER_SITE_OTHER): Payer: Medicare HMO | Admitting: Podiatry

## 2020-04-08 ENCOUNTER — Encounter: Payer: Self-pay | Admitting: Podiatry

## 2020-04-08 VITALS — BP 117/70 | HR 76 | Temp 98.0°F | Resp 14

## 2020-04-08 DIAGNOSIS — M2041 Other hammer toe(s) (acquired), right foot: Secondary | ICD-10-CM

## 2020-04-08 DIAGNOSIS — L84 Corns and callosities: Secondary | ICD-10-CM

## 2020-04-08 DIAGNOSIS — M2042 Other hammer toe(s) (acquired), left foot: Secondary | ICD-10-CM

## 2020-04-08 DIAGNOSIS — M79672 Pain in left foot: Secondary | ICD-10-CM

## 2020-04-08 DIAGNOSIS — G8929 Other chronic pain: Secondary | ICD-10-CM

## 2020-04-09 DIAGNOSIS — I87393 Chronic venous hypertension (idiopathic) with other complications of bilateral lower extremity: Secondary | ICD-10-CM | POA: Diagnosis not present

## 2020-04-12 NOTE — Progress Notes (Signed)
Subjective:   Patient ID: Breanna Williams, female   DOB: 74 y.o.   MRN: [id number]   HPI 74 year old female presents the office today for concerns of a callus on the top of her left second toe.  I saw her back in 2017 for similar issue.  She was at the callus trimmed but she was to see if there is any nonsurgical options that can help straighten out the toes.  She does not want proceed with surgery.  Review of Systems  All other systems reviewed and are negative.  Past Medical History:  Diagnosis Date  . Hyperlipidemia   . Personal history of radiation therapy 2006    Past Surgical History:  Procedure Laterality Date  . BREAST BIOPSY Left 10/16/2008  . BREAST EXCISIONAL BIOPSY Left 1998  . BREAST LUMPECTOMY Left 10/2005     Current Outpatient Medications:  .  Calcium Carbonate (CALCIUM 600 PO), Take by mouth., Disp: , Rfl:  .  dicyclomine (BENTYL) 20 MG tablet, Take 20 mg by mouth every 6 (six) hours., Disp: , Rfl:  .  Loperamide HCl (IMODIUM PO), Take by mouth., Disp: , Rfl:  .  atorvastatin (LIPITOR) 10 MG tablet, , Disp: , Rfl:  .  diphenoxylate-atropine (LOMOTIL) 2.5-0.025 MG tablet, Take 1 tablet by mouth 4 (four) times daily., Disp: , Rfl:  .  pantoprazole (PROTONIX) 40 MG tablet, , Disp: , Rfl:  .  zolpidem (AMBIEN) 5 MG tablet, , Disp: , Rfl:   No Known Allergies       Objective:  Physical Exam  General: AAO x3, NAD  Dermatological: Hyperkeratotic lesion dorsal left second PIPJ.  No ongoing ulceration drainage or signs of infection.  Vascular: Dorsalis Pedis artery and Posterior Tibial artery pedal pulses are 2/4 bilateral with immedate capillary fill time. There is no pain with calf compression, swelling, warmth, erythema.   Neruologic: Grossly intact via light touch bilateral.  Musculoskeletal: Multiple digital contractures are present as well as deviation in the transverse plane of the lesser digits.  There is semirigid except of the second toe which is rigid  on the left side.  Muscular strength 5/5 in all groups tested bilateral.  Gait: Unassisted, Nonantalgic.       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  Unfortunately I do not believe there is any nonsurgical options help straighten the toes.  Continue offloading pads which were dispensed today.  Discussed shoe modifications.  Return if symptoms worsen or fail to improve.  Trula Slade DPM

## 2020-05-02 DIAGNOSIS — K529 Noninfective gastroenteritis and colitis, unspecified: Secondary | ICD-10-CM | POA: Diagnosis not present

## 2020-05-02 DIAGNOSIS — K580 Irritable bowel syndrome with diarrhea: Secondary | ICD-10-CM | POA: Diagnosis not present

## 2020-07-02 DIAGNOSIS — R69 Illness, unspecified: Secondary | ICD-10-CM | POA: Diagnosis not present

## 2020-07-03 DIAGNOSIS — H401211 Low-tension glaucoma, right eye, mild stage: Secondary | ICD-10-CM | POA: Diagnosis not present

## 2020-07-03 DIAGNOSIS — H40022 Open angle with borderline findings, high risk, left eye: Secondary | ICD-10-CM | POA: Diagnosis not present

## 2020-08-05 ENCOUNTER — Other Ambulatory Visit: Payer: Self-pay | Admitting: Family Medicine

## 2020-08-05 DIAGNOSIS — Z1231 Encounter for screening mammogram for malignant neoplasm of breast: Secondary | ICD-10-CM

## 2020-08-14 ENCOUNTER — Ambulatory Visit
Admission: RE | Admit: 2020-08-14 | Discharge: 2020-08-14 | Disposition: A | Discharge status: Home / Self Care | Payer: Medicare HMO | Source: Ambulatory Visit | Attending: Family Medicine | Admitting: Family Medicine

## 2020-08-14 ENCOUNTER — Other Ambulatory Visit: Payer: Self-pay

## 2020-08-14 DIAGNOSIS — Z1231 Encounter for screening mammogram for malignant neoplasm of breast: Secondary | ICD-10-CM

## 2020-08-16 ENCOUNTER — Other Ambulatory Visit: Payer: Self-pay | Admitting: Family Medicine

## 2020-08-16 DIAGNOSIS — R928 Other abnormal and inconclusive findings on diagnostic imaging of breast: Secondary | ICD-10-CM

## 2020-08-28 ENCOUNTER — Ambulatory Visit
Admission: RE | Admit: 2020-08-28 | Discharge: 2020-08-28 | Disposition: A | Discharge status: Home / Self Care | Payer: Medicare HMO | Source: Ambulatory Visit | Attending: Family Medicine | Admitting: Family Medicine

## 2020-08-28 ENCOUNTER — Other Ambulatory Visit: Payer: Self-pay

## 2020-08-28 ENCOUNTER — Ambulatory Visit: Payer: Medicare HMO

## 2020-08-28 DIAGNOSIS — R928 Other abnormal and inconclusive findings on diagnostic imaging of breast: Secondary | ICD-10-CM

## 2020-08-30 DIAGNOSIS — Z23 Encounter for immunization: Secondary | ICD-10-CM | POA: Diagnosis not present

## 2020-08-30 DIAGNOSIS — E785 Hyperlipidemia, unspecified: Secondary | ICD-10-CM | POA: Diagnosis not present

## 2020-08-30 DIAGNOSIS — Z0000 Encounter for general adult medical examination without abnormal findings: Secondary | ICD-10-CM | POA: Diagnosis not present

## 2020-08-30 DIAGNOSIS — Z5181 Encounter for therapeutic drug level monitoring: Secondary | ICD-10-CM | POA: Diagnosis not present

## 2020-08-30 DIAGNOSIS — H1033 Unspecified acute conjunctivitis, bilateral: Secondary | ICD-10-CM | POA: Diagnosis not present

## 2020-10-08 DIAGNOSIS — D1801 Hemangioma of skin and subcutaneous tissue: Secondary | ICD-10-CM | POA: Diagnosis not present

## 2020-10-08 DIAGNOSIS — D485 Neoplasm of uncertain behavior of skin: Secondary | ICD-10-CM | POA: Diagnosis not present

## 2020-10-08 DIAGNOSIS — L821 Other seborrheic keratosis: Secondary | ICD-10-CM | POA: Diagnosis not present

## 2020-10-08 DIAGNOSIS — L570 Actinic keratosis: Secondary | ICD-10-CM | POA: Diagnosis not present

## 2020-10-08 DIAGNOSIS — L905 Scar conditions and fibrosis of skin: Secondary | ICD-10-CM | POA: Diagnosis not present

## 2020-10-08 DIAGNOSIS — D225 Melanocytic nevi of trunk: Secondary | ICD-10-CM | POA: Diagnosis not present

## 2020-10-08 DIAGNOSIS — L814 Other melanin hyperpigmentation: Secondary | ICD-10-CM | POA: Diagnosis not present

## 2020-10-08 DIAGNOSIS — Z85828 Personal history of other malignant neoplasm of skin: Secondary | ICD-10-CM | POA: Diagnosis not present

## 2021-01-06 DIAGNOSIS — H25013 Cortical age-related cataract, bilateral: Secondary | ICD-10-CM | POA: Diagnosis not present

## 2021-01-06 DIAGNOSIS — Z0100 Encounter for examination of eyes and vision without abnormal findings: Secondary | ICD-10-CM | POA: Diagnosis not present

## 2021-01-06 DIAGNOSIS — H524 Presbyopia: Secondary | ICD-10-CM | POA: Diagnosis not present

## 2021-01-06 DIAGNOSIS — H2513 Age-related nuclear cataract, bilateral: Secondary | ICD-10-CM | POA: Diagnosis not present

## 2021-01-06 DIAGNOSIS — H401211 Low-tension glaucoma, right eye, mild stage: Secondary | ICD-10-CM | POA: Diagnosis not present

## 2021-01-06 DIAGNOSIS — H40022 Open angle with borderline findings, high risk, left eye: Secondary | ICD-10-CM | POA: Diagnosis not present

## 2021-02-18 DIAGNOSIS — C44619 Basal cell carcinoma of skin of left upper limb, including shoulder: Secondary | ICD-10-CM | POA: Diagnosis not present

## 2021-02-18 DIAGNOSIS — D485 Neoplasm of uncertain behavior of skin: Secondary | ICD-10-CM | POA: Diagnosis not present

## 2021-02-18 DIAGNOSIS — L570 Actinic keratosis: Secondary | ICD-10-CM | POA: Diagnosis not present

## 2021-03-03 ENCOUNTER — Ambulatory Visit: Payer: Medicare HMO | Admitting: Podiatry

## 2021-03-04 ENCOUNTER — Ambulatory Visit: Payer: Medicare HMO | Admitting: Podiatry

## 2021-03-04 ENCOUNTER — Other Ambulatory Visit: Payer: Self-pay

## 2021-03-04 DIAGNOSIS — L84 Corns and callosities: Secondary | ICD-10-CM | POA: Diagnosis not present

## 2021-03-04 DIAGNOSIS — M2042 Other hammer toe(s) (acquired), left foot: Secondary | ICD-10-CM

## 2021-03-04 DIAGNOSIS — M21619 Bunion of unspecified foot: Secondary | ICD-10-CM | POA: Diagnosis not present

## 2021-03-04 DIAGNOSIS — M2041 Other hammer toe(s) (acquired), right foot: Secondary | ICD-10-CM | POA: Diagnosis not present

## 2021-03-09 NOTE — Progress Notes (Signed)
Subjective: 75 year old female presents today for concerns of callus on her second toes.  She has hammertoe contractures resulting in the callus lesions.  This is been a chronic issue for her.  She is.  She not been interested in surgery.  She states the foot does not hurt much and she is able to exercise and do daily activities without much discomfort. Denies any systemic complaints such as fevers, chills, nausea, vomiting. No acute changes since last appointment, and no other complaints at this time.   Objective: AAO x3, NAD DP/PT pulses palpable bilaterally, CRT less than 3 seconds Bunion, hammertoe deformities are present there is rigid hammertoes present of the second digit resulting in hyperkeratotic lesion on the dorsal PIPJ's.  There is no underlying ulceration drainage or any signs of infection. No pain with calf compression, swelling, warmth, erythema  Assessment: Hyperkeratotic lesions due to digital deformity  Plan: -All treatment options discussed with the patient including all alternatives, risks, complications.  -Sharp debrided the hyperkeratotic lesions without any complications or bleeding.  Continue offloading.  We can discuss surgical intervention if needed but unfortunately to fix the bunion as well as all the lesser digits to help straighten them out given the deformity. -Patient encouraged to call the office with any questions, concerns, change in symptoms.   Trula Slade DPM

## 2021-09-08 DIAGNOSIS — H2513 Age-related nuclear cataract, bilateral: Secondary | ICD-10-CM | POA: Diagnosis not present

## 2021-09-08 DIAGNOSIS — H40022 Open angle with borderline findings, high risk, left eye: Secondary | ICD-10-CM | POA: Diagnosis not present

## 2021-09-08 DIAGNOSIS — H25013 Cortical age-related cataract, bilateral: Secondary | ICD-10-CM | POA: Diagnosis not present

## 2021-09-08 DIAGNOSIS — H401211 Low-tension glaucoma, right eye, mild stage: Secondary | ICD-10-CM | POA: Diagnosis not present

## 2021-09-16 ENCOUNTER — Other Ambulatory Visit: Payer: Self-pay | Admitting: Family Medicine

## 2021-09-16 DIAGNOSIS — E785 Hyperlipidemia, unspecified: Secondary | ICD-10-CM | POA: Diagnosis not present

## 2021-09-16 DIAGNOSIS — G4700 Insomnia, unspecified: Secondary | ICD-10-CM | POA: Diagnosis not present

## 2021-09-16 DIAGNOSIS — M810 Age-related osteoporosis without current pathological fracture: Secondary | ICD-10-CM | POA: Diagnosis not present

## 2021-09-16 DIAGNOSIS — Z5181 Encounter for therapeutic drug level monitoring: Secondary | ICD-10-CM | POA: Diagnosis not present

## 2021-09-16 DIAGNOSIS — Z1231 Encounter for screening mammogram for malignant neoplasm of breast: Secondary | ICD-10-CM

## 2021-09-16 DIAGNOSIS — R6889 Other general symptoms and signs: Secondary | ICD-10-CM | POA: Diagnosis not present

## 2021-09-16 DIAGNOSIS — Z23 Encounter for immunization: Secondary | ICD-10-CM | POA: Diagnosis not present

## 2021-09-16 DIAGNOSIS — Z0000 Encounter for general adult medical examination without abnormal findings: Secondary | ICD-10-CM | POA: Diagnosis not present

## 2021-09-18 ENCOUNTER — Other Ambulatory Visit: Payer: Self-pay | Admitting: Family Medicine

## 2021-09-18 DIAGNOSIS — M810 Age-related osteoporosis without current pathological fracture: Secondary | ICD-10-CM

## 2021-09-18 DIAGNOSIS — E2839 Other primary ovarian failure: Secondary | ICD-10-CM

## 2021-10-03 ENCOUNTER — Other Ambulatory Visit: Payer: Self-pay

## 2021-10-03 ENCOUNTER — Ambulatory Visit
Admission: RE | Admit: 2021-10-03 | Discharge: 2021-10-03 | Disposition: A | Discharge status: Home / Self Care | Payer: Medicare HMO | Source: Ambulatory Visit | Attending: Family Medicine | Admitting: Family Medicine

## 2021-10-03 DIAGNOSIS — Z1231 Encounter for screening mammogram for malignant neoplasm of breast: Secondary | ICD-10-CM

## 2021-10-18 DIAGNOSIS — H6123 Impacted cerumen, bilateral: Secondary | ICD-10-CM | POA: Diagnosis not present

## 2021-10-18 DIAGNOSIS — H60391 Other infective otitis externa, right ear: Secondary | ICD-10-CM | POA: Diagnosis not present

## 2021-10-30 ENCOUNTER — Other Ambulatory Visit: Payer: Medicare HMO

## 2022-01-18 IMAGING — MG DIGITAL SCREENING BILAT W/ TOMO W/ CAD
8 series · 9 of 24 positions shown · non-contrast
Comparison: Previous exam(s).

CLINICAL DATA: Screening.

EXAM:
DIGITAL SCREENING BILATERAL MAMMOGRAM WITH TOMO AND CAD

[R CC synth-2D]
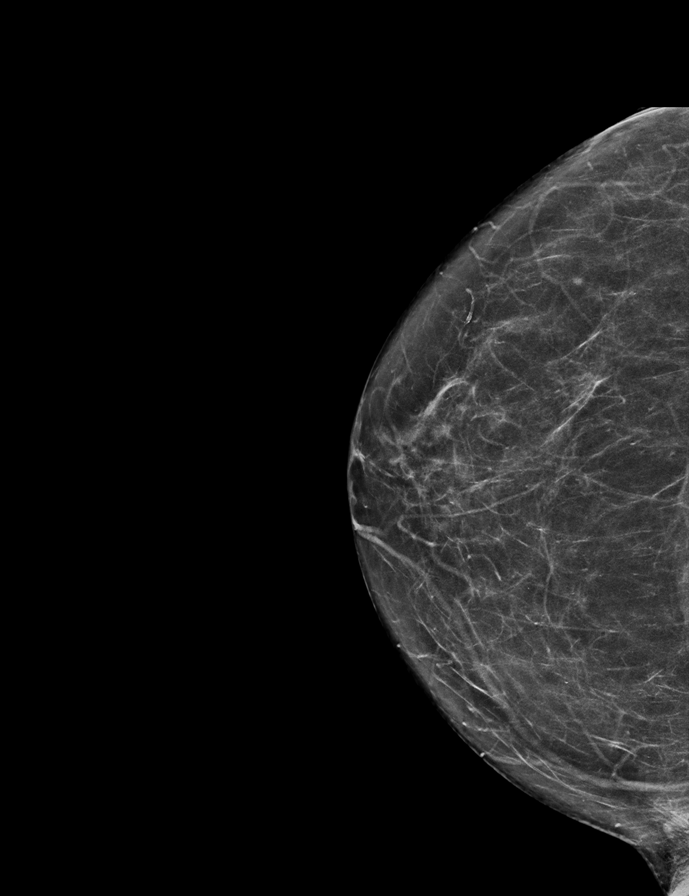

[L CC synth-2D]
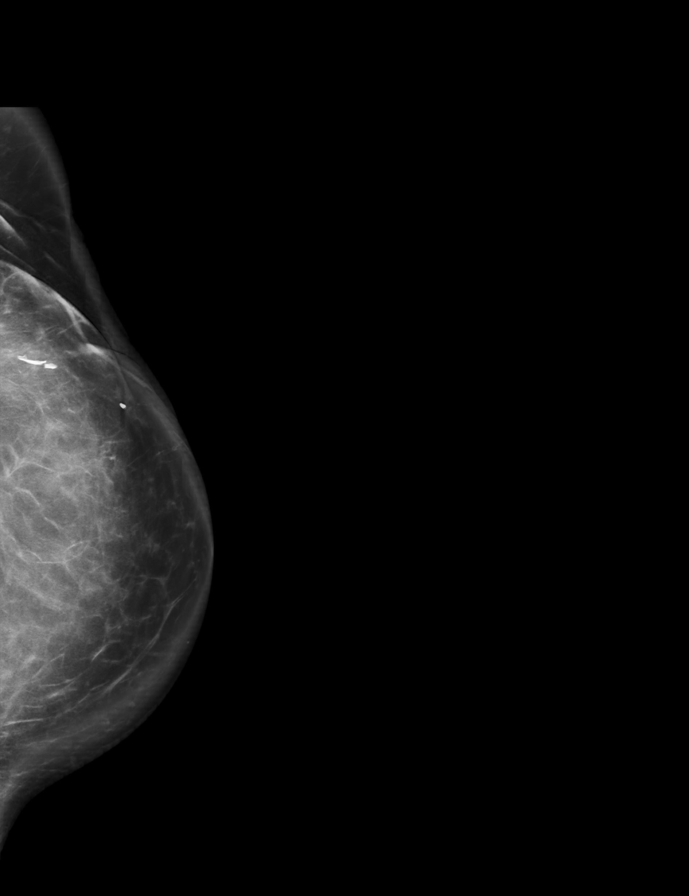

[L MLO synth-2D]
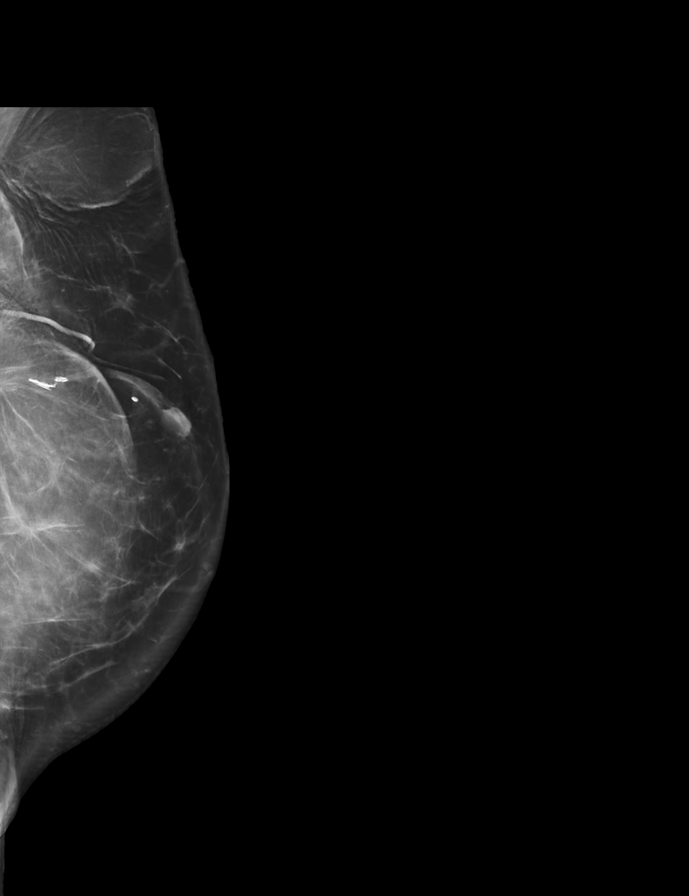

[R MLO synth-2D]
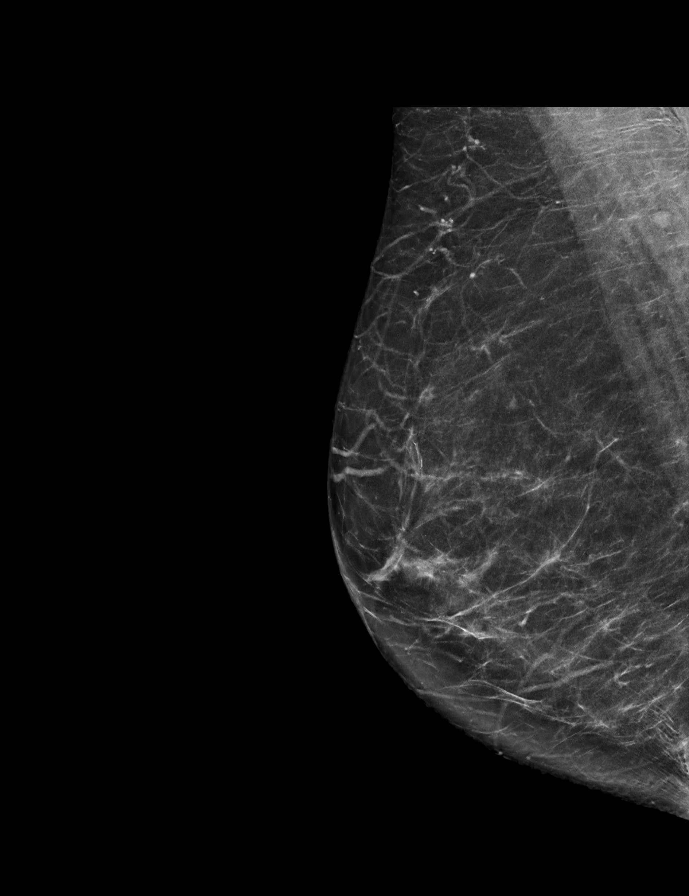

[L CC tomo · 2 of 106 frames shown]
[frame 35/106]
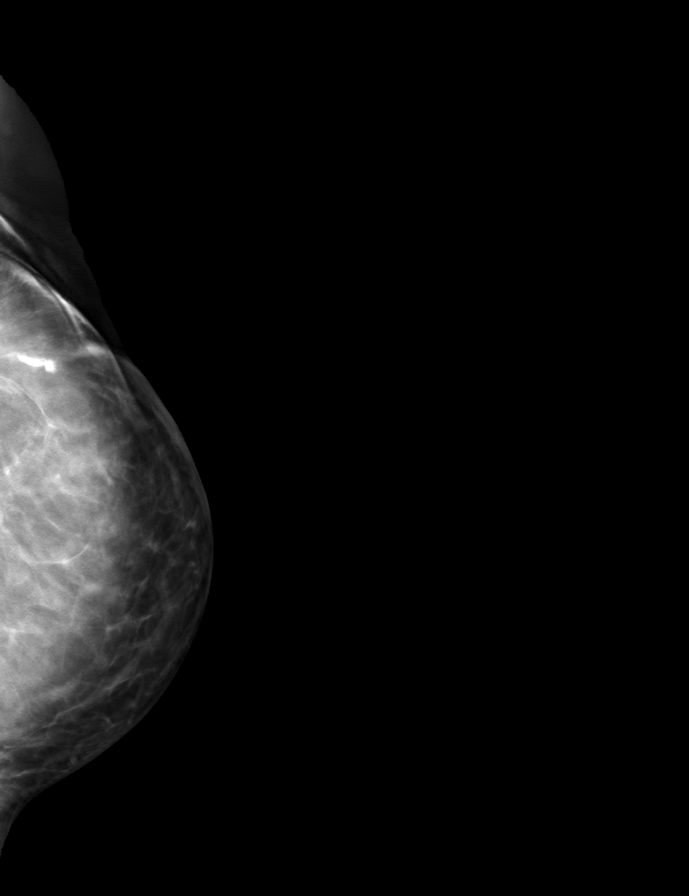
[frame 53/106]
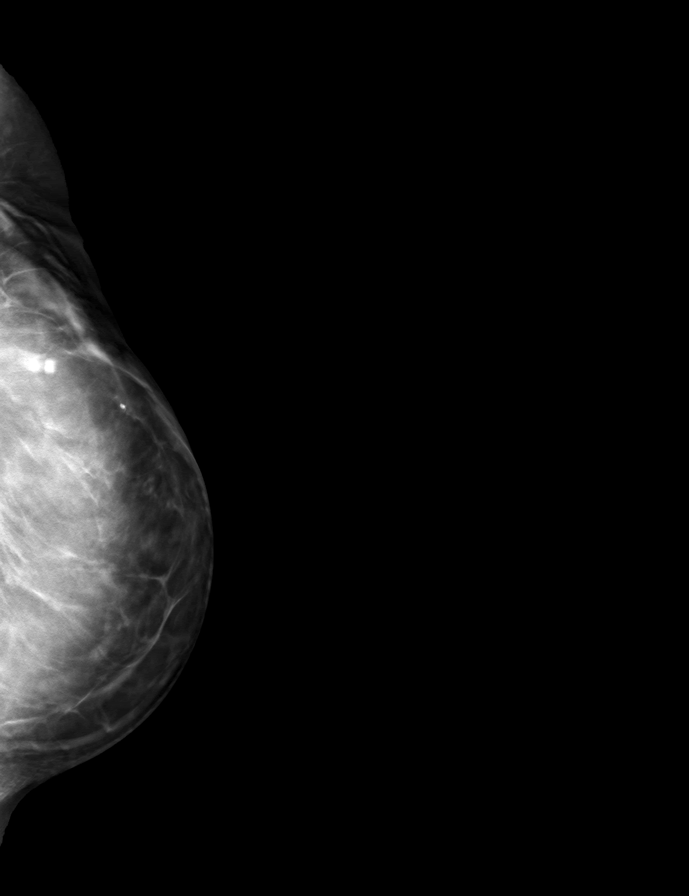

[L MLO tomo · tomo slice 50/99.0]
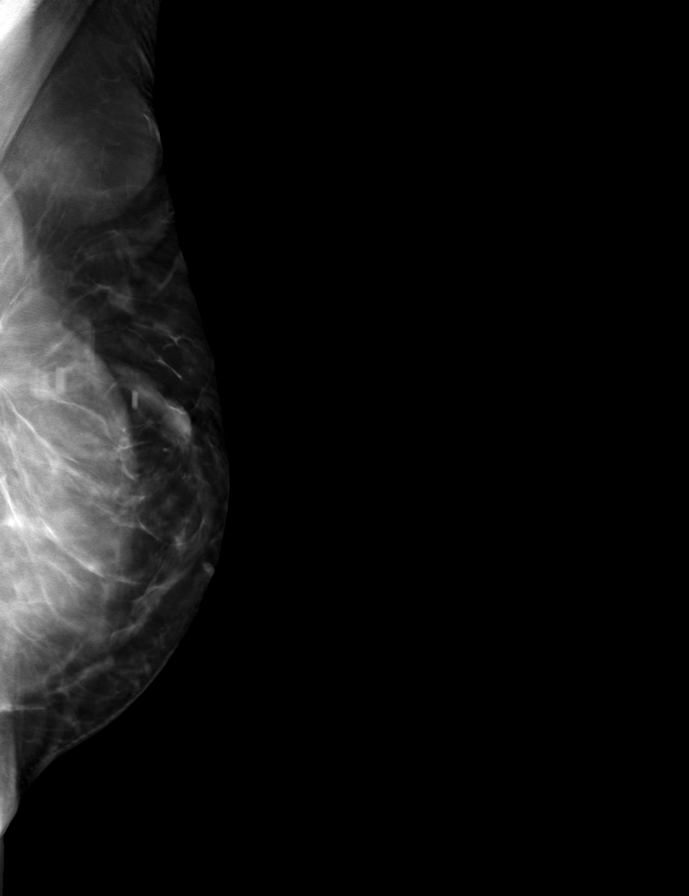

[R MLO tomo · tomo slice 35/69.0]
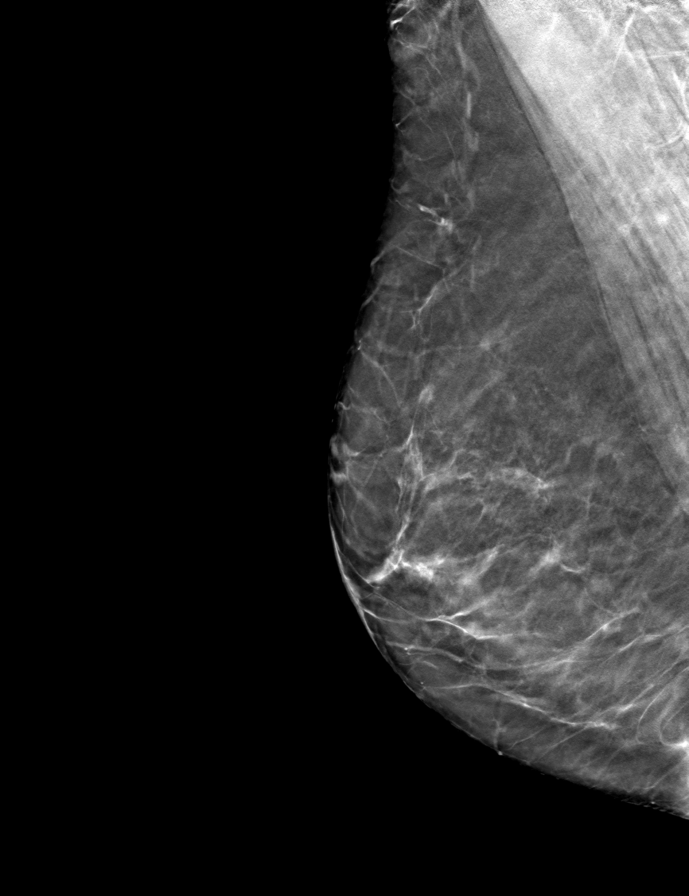

[R CC tomo · tomo slice 33/65.0]
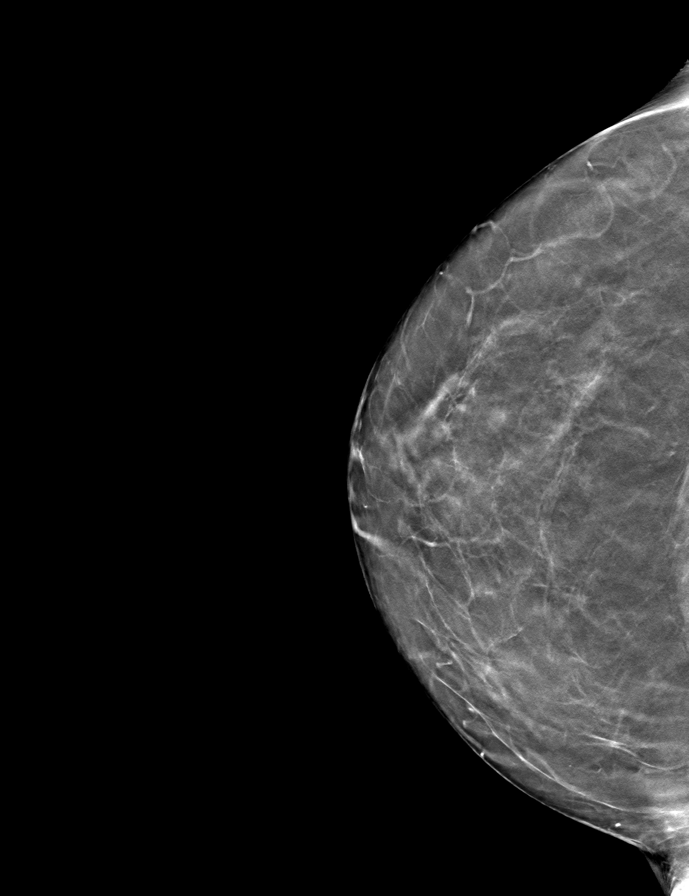

[9 of 24 positions shown; findings below may reference images not displayed]

ACR Breast Density Category b: There are scattered areas of
fibroglandular density.
FINDINGS: In the right breast, possible distortion warrants further
evaluation. In the left breast, no findings suspicious for
malignancy. Images were processed with CAD.
IMPRESSION: Further evaluation is suggested for possible distortion in the right
breast.

RECOMMENDATION:
Diagnostic mammogram and possibly ultrasound of the right breast.
(Code:[id number]-[id number])

The patient will be contacted regarding the findings, and additional
imaging will be scheduled.

BI-RADS CATEGORY  0: Incomplete. Need additional imaging evaluation
and/or prior mammograms for comparison.

## 2022-02-04 DIAGNOSIS — L821 Other seborrheic keratosis: Secondary | ICD-10-CM | POA: Diagnosis not present

## 2022-02-04 DIAGNOSIS — L570 Actinic keratosis: Secondary | ICD-10-CM | POA: Diagnosis not present

## 2022-02-04 DIAGNOSIS — L814 Other melanin hyperpigmentation: Secondary | ICD-10-CM | POA: Diagnosis not present

## 2022-02-04 DIAGNOSIS — C44619 Basal cell carcinoma of skin of left upper limb, including shoulder: Secondary | ICD-10-CM | POA: Diagnosis not present

## 2022-02-04 DIAGNOSIS — D225 Melanocytic nevi of trunk: Secondary | ICD-10-CM | POA: Diagnosis not present

## 2022-03-23 DIAGNOSIS — H04123 Dry eye syndrome of bilateral lacrimal glands: Secondary | ICD-10-CM | POA: Diagnosis not present

## 2022-03-23 DIAGNOSIS — H401211 Low-tension glaucoma, right eye, mild stage: Secondary | ICD-10-CM | POA: Diagnosis not present

## 2022-03-23 DIAGNOSIS — H40022 Open angle with borderline findings, high risk, left eye: Secondary | ICD-10-CM | POA: Diagnosis not present

## 2022-03-23 DIAGNOSIS — H25813 Combined forms of age-related cataract, bilateral: Secondary | ICD-10-CM | POA: Diagnosis not present

## 2022-05-21 ENCOUNTER — Other Ambulatory Visit: Payer: Medicare HMO

## 2022-05-22 DIAGNOSIS — R413 Other amnesia: Secondary | ICD-10-CM | POA: Diagnosis not present

## 2022-05-22 DIAGNOSIS — R0989 Other specified symptoms and signs involving the circulatory and respiratory systems: Secondary | ICD-10-CM | POA: Diagnosis not present

## 2022-05-22 DIAGNOSIS — J189 Pneumonia, unspecified organism: Secondary | ICD-10-CM | POA: Diagnosis not present

## 2022-05-22 DIAGNOSIS — Z20822 Contact with and (suspected) exposure to covid-19: Secondary | ICD-10-CM | POA: Diagnosis not present

## 2022-05-27 DIAGNOSIS — R410 Disorientation, unspecified: Secondary | ICD-10-CM | POA: Diagnosis not present

## 2022-05-27 DIAGNOSIS — R051 Acute cough: Secondary | ICD-10-CM | POA: Diagnosis not present

## 2022-07-13 ENCOUNTER — Ambulatory Visit (INDEPENDENT_AMBULATORY_CARE_PROVIDER_SITE_OTHER): Payer: Medicare HMO | Admitting: Podiatry

## 2022-07-13 DIAGNOSIS — M216X1 Other acquired deformities of right foot: Secondary | ICD-10-CM | POA: Diagnosis not present

## 2022-07-13 DIAGNOSIS — M2041 Other hammer toe(s) (acquired), right foot: Secondary | ICD-10-CM | POA: Diagnosis not present

## 2022-07-13 DIAGNOSIS — M2042 Other hammer toe(s) (acquired), left foot: Secondary | ICD-10-CM | POA: Diagnosis not present

## 2022-07-13 DIAGNOSIS — M216X2 Other acquired deformities of left foot: Secondary | ICD-10-CM

## 2022-07-13 DIAGNOSIS — L84 Corns and callosities: Secondary | ICD-10-CM | POA: Diagnosis not present

## 2022-07-13 DIAGNOSIS — M21619 Bunion of unspecified foot: Secondary | ICD-10-CM

## 2022-07-13 NOTE — Progress Notes (Signed)
Subjective: 76 year old female presents today for concerns of callus on her second toes.  She gets painful callus on the plantar left foot worse on the right.  No open sores that she reports no swelling or redness or any drainage.  No recent treatment otherwise.  Also asking for the nails be trimmed today as she has difficulty doing herself.  Objective: AAO x3, NAD DP/PT pulses palpable bilaterally, CRT less than 3 seconds Bunion, hammertoe deformities are present there is rigid hammertoes present of the second digit resulting in hyperkeratotic lesion on the dorsal PIPJ's.  Preulcerative hyperkeratotic lesion submetatarsal 3 bilaterally left foot worse than right.  There is no open lesion identified.  The nails are mildly elongated and dystrophic.  No pain in the nails and no swelling redness or drainage.  No open lesions otherwise.  Prominent metatarsal heads. No pain with calf compression, swelling, warmth, erythema  Assessment: Hyperkeratotic lesions due to digital deformity; prominent metatarsal heads.  Plan: -All treatment options discussed with the patient including all alternatives, risks, complications.  -Sharp debrided the hyperkeratotic lesions without any complications or bleeding x 4 as a courtesy we discussed with conservative as well as surgical treatment measures.  Conservative discussed moisturizer, offloading which she is in continue with.  Discussed using a pumice stone if needed to help with the callus.  Monitor for any signs or symptoms of infection. -Also encouraged her to be the nails without any complications or bleeding.  Return if symptoms worsen or fail to improve.  Trula Slade DPM

## 2022-09-22 DIAGNOSIS — Z23 Encounter for immunization: Secondary | ICD-10-CM | POA: Diagnosis not present

## 2022-09-22 DIAGNOSIS — Z5181 Encounter for therapeutic drug level monitoring: Secondary | ICD-10-CM | POA: Diagnosis not present

## 2022-09-22 DIAGNOSIS — R051 Acute cough: Secondary | ICD-10-CM | POA: Diagnosis not present

## 2022-09-22 DIAGNOSIS — Z0000 Encounter for general adult medical examination without abnormal findings: Secondary | ICD-10-CM | POA: Diagnosis not present

## 2022-09-22 DIAGNOSIS — G4700 Insomnia, unspecified: Secondary | ICD-10-CM | POA: Diagnosis not present

## 2022-09-22 DIAGNOSIS — E785 Hyperlipidemia, unspecified: Secondary | ICD-10-CM | POA: Diagnosis not present

## 2022-09-22 DIAGNOSIS — R197 Diarrhea, unspecified: Secondary | ICD-10-CM | POA: Diagnosis not present

## 2022-09-22 DIAGNOSIS — M810 Age-related osteoporosis without current pathological fracture: Secondary | ICD-10-CM | POA: Diagnosis not present

## 2022-09-23 ENCOUNTER — Other Ambulatory Visit (HOSPITAL_BASED_OUTPATIENT_CLINIC_OR_DEPARTMENT_OTHER): Payer: Self-pay | Admitting: Family Medicine

## 2022-09-23 DIAGNOSIS — M810 Age-related osteoporosis without current pathological fracture: Secondary | ICD-10-CM

## 2022-09-28 DIAGNOSIS — H401211 Low-tension glaucoma, right eye, mild stage: Secondary | ICD-10-CM | POA: Diagnosis not present

## 2022-09-28 DIAGNOSIS — H40022 Open angle with borderline findings, high risk, left eye: Secondary | ICD-10-CM | POA: Diagnosis not present

## 2022-10-01 ENCOUNTER — Other Ambulatory Visit (HOSPITAL_BASED_OUTPATIENT_CLINIC_OR_DEPARTMENT_OTHER): Payer: Self-pay | Admitting: Family Medicine

## 2022-10-01 DIAGNOSIS — Z1231 Encounter for screening mammogram for malignant neoplasm of breast: Secondary | ICD-10-CM

## 2022-10-13 ENCOUNTER — Ambulatory Visit (HOSPITAL_BASED_OUTPATIENT_CLINIC_OR_DEPARTMENT_OTHER)
Admission: RE | Admit: 2022-10-13 | Discharge: 2022-10-13 | Disposition: A | Discharge status: Home / Self Care | Payer: Medicare HMO | Source: Ambulatory Visit | Attending: Family Medicine | Admitting: Family Medicine

## 2022-10-13 ENCOUNTER — Encounter (HOSPITAL_BASED_OUTPATIENT_CLINIC_OR_DEPARTMENT_OTHER): Payer: Self-pay | Admitting: Radiology

## 2022-10-13 DIAGNOSIS — Z780 Asymptomatic menopausal state: Secondary | ICD-10-CM | POA: Diagnosis not present

## 2022-10-13 DIAGNOSIS — M85851 Other specified disorders of bone density and structure, right thigh: Secondary | ICD-10-CM | POA: Diagnosis not present

## 2022-10-13 DIAGNOSIS — Z1231 Encounter for screening mammogram for malignant neoplasm of breast: Secondary | ICD-10-CM

## 2022-10-13 DIAGNOSIS — M810 Age-related osteoporosis without current pathological fracture: Secondary | ICD-10-CM | POA: Diagnosis not present

## 2022-10-26 DIAGNOSIS — M810 Age-related osteoporosis without current pathological fracture: Secondary | ICD-10-CM | POA: Diagnosis not present

## 2022-10-26 DIAGNOSIS — R490 Dysphonia: Secondary | ICD-10-CM | POA: Diagnosis not present

## 2022-10-28 DIAGNOSIS — H401211 Low-tension glaucoma, right eye, mild stage: Secondary | ICD-10-CM | POA: Diagnosis not present

## 2022-10-28 DIAGNOSIS — H40022 Open angle with borderline findings, high risk, left eye: Secondary | ICD-10-CM | POA: Diagnosis not present

## 2023-02-11 ENCOUNTER — Ambulatory Visit: Payer: Medicare HMO | Admitting: Podiatry

## 2023-02-11 ENCOUNTER — Encounter: Payer: Self-pay | Admitting: Podiatry

## 2023-02-11 DIAGNOSIS — D485 Neoplasm of uncertain behavior of skin: Secondary | ICD-10-CM | POA: Diagnosis not present

## 2023-02-11 DIAGNOSIS — L814 Other melanin hyperpigmentation: Secondary | ICD-10-CM | POA: Diagnosis not present

## 2023-02-11 DIAGNOSIS — M2042 Other hammer toe(s) (acquired), left foot: Secondary | ICD-10-CM

## 2023-02-11 DIAGNOSIS — Z85828 Personal history of other malignant neoplasm of skin: Secondary | ICD-10-CM | POA: Diagnosis not present

## 2023-02-11 DIAGNOSIS — M7752 Other enthesopathy of left foot: Secondary | ICD-10-CM

## 2023-02-11 DIAGNOSIS — Z7189 Other specified counseling: Secondary | ICD-10-CM | POA: Diagnosis not present

## 2023-02-11 DIAGNOSIS — L820 Inflamed seborrheic keratosis: Secondary | ICD-10-CM | POA: Diagnosis not present

## 2023-02-11 DIAGNOSIS — Z08 Encounter for follow-up examination after completed treatment for malignant neoplasm: Secondary | ICD-10-CM | POA: Diagnosis not present

## 2023-02-11 DIAGNOSIS — L538 Other specified erythematous conditions: Secondary | ICD-10-CM | POA: Diagnosis not present

## 2023-02-11 DIAGNOSIS — C44622 Squamous cell carcinoma of skin of right upper limb, including shoulder: Secondary | ICD-10-CM | POA: Diagnosis not present

## 2023-02-11 DIAGNOSIS — D229 Melanocytic nevi, unspecified: Secondary | ICD-10-CM | POA: Diagnosis not present

## 2023-02-11 DIAGNOSIS — M2041 Other hammer toe(s) (acquired), right foot: Secondary | ICD-10-CM | POA: Diagnosis not present

## 2023-02-11 DIAGNOSIS — L570 Actinic keratosis: Secondary | ICD-10-CM | POA: Diagnosis not present

## 2023-02-11 DIAGNOSIS — L821 Other seborrheic keratosis: Secondary | ICD-10-CM | POA: Diagnosis not present

## 2023-02-11 MED ORDER — TRIAMCINOLONE ACETONIDE 10 MG/ML IJ SUSP
10.0000 mg | Freq: Once | INTRAMUSCULAR | Status: AC
  Administered 2023-02-11: 10 mg

## 2023-02-11 NOTE — Progress Notes (Signed)
Patient states subjective:   Patient ID: Breanna Williams, female   DOB: 77 y.o.   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

## 2023-03-09 DIAGNOSIS — L84 Corns and callosities: Secondary | ICD-10-CM | POA: Diagnosis not present

## 2023-03-09 DIAGNOSIS — M2042 Other hammer toe(s) (acquired), left foot: Secondary | ICD-10-CM | POA: Diagnosis not present

## 2023-03-09 DIAGNOSIS — M2041 Other hammer toe(s) (acquired), right foot: Secondary | ICD-10-CM | POA: Diagnosis not present

## 2023-03-18 DIAGNOSIS — C44622 Squamous cell carcinoma of skin of right upper limb, including shoulder: Secondary | ICD-10-CM | POA: Diagnosis not present

## 2023-03-22 DIAGNOSIS — L84 Corns and callosities: Secondary | ICD-10-CM | POA: Diagnosis not present

## 2023-03-22 DIAGNOSIS — M7741 Metatarsalgia, right foot: Secondary | ICD-10-CM | POA: Diagnosis not present

## 2023-03-22 DIAGNOSIS — M2042 Other hammer toe(s) (acquired), left foot: Secondary | ICD-10-CM | POA: Diagnosis not present

## 2023-03-22 DIAGNOSIS — M2041 Other hammer toe(s) (acquired), right foot: Secondary | ICD-10-CM | POA: Diagnosis not present

## 2023-04-01 DIAGNOSIS — Z6823 Body mass index (BMI) 23.0-23.9, adult: Secondary | ICD-10-CM | POA: Diagnosis not present

## 2023-04-01 DIAGNOSIS — H6123 Impacted cerumen, bilateral: Secondary | ICD-10-CM | POA: Diagnosis not present

## 2023-05-11 DIAGNOSIS — M2042 Other hammer toe(s) (acquired), left foot: Secondary | ICD-10-CM | POA: Diagnosis not present

## 2023-05-11 DIAGNOSIS — M7742 Metatarsalgia, left foot: Secondary | ICD-10-CM | POA: Diagnosis not present

## 2023-06-14 DIAGNOSIS — M5459 Other low back pain: Secondary | ICD-10-CM | POA: Diagnosis not present

## 2023-07-02 DIAGNOSIS — M5451 Vertebrogenic low back pain: Secondary | ICD-10-CM | POA: Diagnosis not present

## 2023-07-06 DIAGNOSIS — M5451 Vertebrogenic low back pain: Secondary | ICD-10-CM | POA: Diagnosis not present

## 2023-07-08 DIAGNOSIS — M5451 Vertebrogenic low back pain: Secondary | ICD-10-CM | POA: Diagnosis not present

## 2023-07-13 DIAGNOSIS — M5451 Vertebrogenic low back pain: Secondary | ICD-10-CM | POA: Diagnosis not present

## 2023-07-14 DIAGNOSIS — R69 Illness, unspecified: Secondary | ICD-10-CM | POA: Diagnosis not present

## 2023-07-15 DIAGNOSIS — M5451 Vertebrogenic low back pain: Secondary | ICD-10-CM | POA: Diagnosis not present

## 2023-07-20 DIAGNOSIS — M5451 Vertebrogenic low back pain: Secondary | ICD-10-CM | POA: Diagnosis not present

## 2023-07-22 DIAGNOSIS — M5451 Vertebrogenic low back pain: Secondary | ICD-10-CM | POA: Diagnosis not present

## 2023-07-26 DIAGNOSIS — M5451 Vertebrogenic low back pain: Secondary | ICD-10-CM | POA: Diagnosis not present

## 2023-08-14 DIAGNOSIS — R69 Illness, unspecified: Secondary | ICD-10-CM | POA: Diagnosis not present

## 2023-08-18 DIAGNOSIS — H2513 Age-related nuclear cataract, bilateral: Secondary | ICD-10-CM | POA: Diagnosis not present

## 2023-08-18 DIAGNOSIS — H25013 Cortical age-related cataract, bilateral: Secondary | ICD-10-CM | POA: Diagnosis not present

## 2023-08-18 DIAGNOSIS — H401131 Primary open-angle glaucoma, bilateral, mild stage: Secondary | ICD-10-CM | POA: Diagnosis not present

## 2023-08-18 DIAGNOSIS — H25043 Posterior subcapsular polar age-related cataract, bilateral: Secondary | ICD-10-CM | POA: Diagnosis not present

## 2023-08-18 DIAGNOSIS — H5213 Myopia, bilateral: Secondary | ICD-10-CM | POA: Diagnosis not present

## 2023-09-10 DIAGNOSIS — M4317 Spondylolisthesis, lumbosacral region: Secondary | ICD-10-CM | POA: Diagnosis not present

## 2023-09-14 ENCOUNTER — Other Ambulatory Visit (HOSPITAL_BASED_OUTPATIENT_CLINIC_OR_DEPARTMENT_OTHER): Payer: Self-pay | Admitting: Family Medicine

## 2023-09-14 DIAGNOSIS — Z1231 Encounter for screening mammogram for malignant neoplasm of breast: Secondary | ICD-10-CM

## 2023-09-14 DIAGNOSIS — R69 Illness, unspecified: Secondary | ICD-10-CM | POA: Diagnosis not present

## 2023-09-27 DIAGNOSIS — L821 Other seborrheic keratosis: Secondary | ICD-10-CM | POA: Diagnosis not present

## 2023-09-27 DIAGNOSIS — Z08 Encounter for follow-up examination after completed treatment for malignant neoplasm: Secondary | ICD-10-CM | POA: Diagnosis not present

## 2023-09-27 DIAGNOSIS — Z85828 Personal history of other malignant neoplasm of skin: Secondary | ICD-10-CM | POA: Diagnosis not present

## 2023-09-27 DIAGNOSIS — L570 Actinic keratosis: Secondary | ICD-10-CM | POA: Diagnosis not present

## 2023-09-30 DIAGNOSIS — E785 Hyperlipidemia, unspecified: Secondary | ICD-10-CM | POA: Diagnosis not present

## 2023-09-30 DIAGNOSIS — Z5181 Encounter for therapeutic drug level monitoring: Secondary | ICD-10-CM | POA: Diagnosis not present

## 2023-09-30 DIAGNOSIS — M810 Age-related osteoporosis without current pathological fracture: Secondary | ICD-10-CM | POA: Diagnosis not present

## 2023-10-20 DIAGNOSIS — Z860100 Personal history of colon polyps, unspecified: Secondary | ICD-10-CM | POA: Diagnosis not present

## 2023-10-20 DIAGNOSIS — K580 Irritable bowel syndrome with diarrhea: Secondary | ICD-10-CM | POA: Diagnosis not present

## 2023-10-21 ENCOUNTER — Ambulatory Visit (HOSPITAL_BASED_OUTPATIENT_CLINIC_OR_DEPARTMENT_OTHER)
Admission: RE | Admit: 2023-10-21 | Discharge: 2023-10-21 | Disposition: A | Discharge status: Home / Self Care | Payer: Medicare HMO | Source: Ambulatory Visit | Attending: Family Medicine | Admitting: Family Medicine

## 2023-10-21 DIAGNOSIS — Z1231 Encounter for screening mammogram for malignant neoplasm of breast: Secondary | ICD-10-CM | POA: Insufficient documentation

## 2023-10-26 DIAGNOSIS — R69 Illness, unspecified: Secondary | ICD-10-CM | POA: Diagnosis not present

## 2023-11-09 DIAGNOSIS — R69 Illness, unspecified: Secondary | ICD-10-CM | POA: Diagnosis not present

## 2023-11-16 DIAGNOSIS — R69 Illness, unspecified: Secondary | ICD-10-CM | POA: Diagnosis not present

## 2023-11-30 DIAGNOSIS — R69 Illness, unspecified: Secondary | ICD-10-CM | POA: Diagnosis not present

## 2023-12-07 DIAGNOSIS — R69 Illness, unspecified: Secondary | ICD-10-CM | POA: Diagnosis not present

## 2024-01-12 DIAGNOSIS — M7061 Trochanteric bursitis, right hip: Secondary | ICD-10-CM | POA: Diagnosis not present

## 2024-01-12 DIAGNOSIS — M545 Low back pain, unspecified: Secondary | ICD-10-CM | POA: Diagnosis not present

## 2024-02-28 DIAGNOSIS — H25043 Posterior subcapsular polar age-related cataract, bilateral: Secondary | ICD-10-CM | POA: Diagnosis not present

## 2024-02-28 DIAGNOSIS — H2513 Age-related nuclear cataract, bilateral: Secondary | ICD-10-CM | POA: Diagnosis not present

## 2024-02-28 DIAGNOSIS — H401231 Low-tension glaucoma, bilateral, mild stage: Secondary | ICD-10-CM | POA: Diagnosis not present

## 2024-02-28 DIAGNOSIS — H25013 Cortical age-related cataract, bilateral: Secondary | ICD-10-CM | POA: Diagnosis not present

## 2024-03-20 DIAGNOSIS — D485 Neoplasm of uncertain behavior of skin: Secondary | ICD-10-CM | POA: Diagnosis not present

## 2024-03-20 DIAGNOSIS — Z7189 Other specified counseling: Secondary | ICD-10-CM | POA: Diagnosis not present

## 2024-03-20 DIAGNOSIS — C44619 Basal cell carcinoma of skin of left upper limb, including shoulder: Secondary | ICD-10-CM | POA: Diagnosis not present

## 2024-03-20 DIAGNOSIS — Z85828 Personal history of other malignant neoplasm of skin: Secondary | ICD-10-CM | POA: Diagnosis not present

## 2024-03-20 DIAGNOSIS — Z08 Encounter for follow-up examination after completed treatment for malignant neoplasm: Secondary | ICD-10-CM | POA: Diagnosis not present

## 2024-03-20 DIAGNOSIS — L814 Other melanin hyperpigmentation: Secondary | ICD-10-CM | POA: Diagnosis not present

## 2024-03-20 DIAGNOSIS — L538 Other specified erythematous conditions: Secondary | ICD-10-CM | POA: Diagnosis not present

## 2024-03-20 DIAGNOSIS — D225 Melanocytic nevi of trunk: Secondary | ICD-10-CM | POA: Diagnosis not present

## 2024-03-20 DIAGNOSIS — L820 Inflamed seborrheic keratosis: Secondary | ICD-10-CM | POA: Diagnosis not present

## 2024-03-20 DIAGNOSIS — D0439 Carcinoma in situ of skin of other parts of face: Secondary | ICD-10-CM | POA: Diagnosis not present

## 2024-03-20 DIAGNOSIS — L570 Actinic keratosis: Secondary | ICD-10-CM | POA: Diagnosis not present

## 2024-03-20 DIAGNOSIS — L821 Other seborrheic keratosis: Secondary | ICD-10-CM | POA: Diagnosis not present

## 2024-03-30 DIAGNOSIS — Z860101 Personal history of adenomatous and serrated colon polyps: Secondary | ICD-10-CM | POA: Diagnosis not present

## 2024-04-18 DIAGNOSIS — H903 Sensorineural hearing loss, bilateral: Secondary | ICD-10-CM | POA: Diagnosis not present

## 2024-04-18 DIAGNOSIS — H6123 Impacted cerumen, bilateral: Secondary | ICD-10-CM | POA: Diagnosis not present

## 2024-04-27 DIAGNOSIS — K5730 Diverticulosis of large intestine without perforation or abscess without bleeding: Secondary | ICD-10-CM | POA: Diagnosis not present

## 2024-04-27 DIAGNOSIS — Z09 Encounter for follow-up examination after completed treatment for conditions other than malignant neoplasm: Secondary | ICD-10-CM | POA: Diagnosis not present

## 2024-04-27 DIAGNOSIS — Z860100 Personal history of colon polyps, unspecified: Secondary | ICD-10-CM | POA: Diagnosis not present

## 2024-04-27 DIAGNOSIS — K5289 Other specified noninfective gastroenteritis and colitis: Secondary | ICD-10-CM | POA: Diagnosis not present

## 2024-04-27 DIAGNOSIS — K626 Ulcer of anus and rectum: Secondary | ICD-10-CM | POA: Diagnosis not present

## 2024-04-27 DIAGNOSIS — K621 Rectal polyp: Secondary | ICD-10-CM | POA: Diagnosis not present

## 2024-05-01 DIAGNOSIS — K5289 Other specified noninfective gastroenteritis and colitis: Secondary | ICD-10-CM | POA: Diagnosis not present

## 2024-05-01 DIAGNOSIS — K621 Rectal polyp: Secondary | ICD-10-CM | POA: Diagnosis not present

## 2024-05-09 DIAGNOSIS — H2512 Age-related nuclear cataract, left eye: Secondary | ICD-10-CM | POA: Diagnosis not present

## 2024-05-09 DIAGNOSIS — H25012 Cortical age-related cataract, left eye: Secondary | ICD-10-CM | POA: Diagnosis not present

## 2024-05-09 DIAGNOSIS — H25042 Posterior subcapsular polar age-related cataract, left eye: Secondary | ICD-10-CM | POA: Diagnosis not present

## 2024-05-09 DIAGNOSIS — H25812 Combined forms of age-related cataract, left eye: Secondary | ICD-10-CM | POA: Diagnosis not present

## 2024-05-18 ENCOUNTER — Ambulatory Visit
Admission: RE | Admit: 2024-05-18 | Discharge: 2024-05-18 | Disposition: A | Discharge status: Home / Self Care | Source: Ambulatory Visit | Attending: Family Medicine | Admitting: Family Medicine

## 2024-05-18 ENCOUNTER — Other Ambulatory Visit: Payer: Self-pay

## 2024-05-18 ENCOUNTER — Ambulatory Visit: Admitting: Family Medicine

## 2024-05-18 ENCOUNTER — Encounter: Payer: Self-pay | Admitting: Family Medicine

## 2024-05-18 VITALS — BP 139/60 | Ht 64.0 in | Wt 135.0 lb

## 2024-05-18 DIAGNOSIS — M545 Low back pain, unspecified: Secondary | ICD-10-CM

## 2024-05-18 DIAGNOSIS — G8929 Other chronic pain: Secondary | ICD-10-CM | POA: Diagnosis not present

## 2024-05-18 DIAGNOSIS — M549 Dorsalgia, unspecified: Secondary | ICD-10-CM | POA: Diagnosis not present

## 2024-05-18 DIAGNOSIS — M25551 Pain in right hip: Secondary | ICD-10-CM

## 2024-05-18 MED ORDER — PREDNISONE 10 MG PO TABS: ORAL_TABLET | ORAL | 0 refills | Status: AC

## 2024-05-18 NOTE — Patient Instructions (Signed)
 [facility] [address] [place], [place] [phone]

## 2024-05-18 NOTE — Progress Notes (Signed)
 PCP: Ransom Byers, MD  Subjective:   CC: RT low back pain  HPI: Patient is a 78 y.o. female here for RT low back pain.  Breanna Williams says her RT low back pain started 2-3 months ago and is localized around her RT buttock. She denies any radicular pain, numbness or tingling in her RT leg. Her pain is mostly with getting out of the car, walking and exercise. Denies pain with sitting. She has tried Advil with minimal relief and has had some benefit with a heating pad. She has been doing some stretching at home and continues to do pilates. Denies any weakness, fecal/urinary incontinence, saddle anesthesia. Hx of RT greater trochanteric bursitis and radicular pain on the LT.   Past Medical History:  Diagnosis Date   Hyperlipidemia    Personal history of radiation therapy 2006    Current Outpatient Medications on File Prior to Visit  Medication Sig Dispense Refill   atorvastatin (LIPITOR) 10 MG tablet      Calcium Carbonate (CALCIUM 600 PO) Take by mouth.     dicyclomine (BENTYL) 20 MG tablet Take 20 mg by mouth every 6 (six) hours.     diphenoxylate-atropine (LOMOTIL) 2.5-0.025 MG tablet Take 1 tablet by mouth 4 (four) times daily.     Loperamide HCl (IMODIUM PO) Take by mouth.     pantoprazole (PROTONIX) 40 MG tablet      zolpidem (AMBIEN) 5 MG tablet      No current facility-administered medications on file prior to visit.    Past Surgical History:  Procedure Laterality Date   BREAST BIOPSY Left 10/16/2008   BREAST EXCISIONAL BIOPSY Left 1998   BREAST LUMPECTOMY Left 10/2005    No Known Allergies  BP 139/60   Ht 5\' 4"  (1.626 m)   Wt 135 lb (61.2 kg)   BMI 23.17 kg/m       No data to display              No data to display              Objective:  Physical Exam:  Gen: NAD, comfortable in exam room  RT hip/low back Exam: Inspection: No obvious deformity or scoliosis. Palpation: TTP along iliac crest and greater trochanter on the RT. No bony process or  paraspinal muscle TTP of the lumbar spine. ROM: Full ROM. Pain with external rotation of the RT hip. No pain with back extension/flexion. Pain with lateral bending and twisting of the back. Strength: 5/5 strength with knee extension/flexion, hip abduction/adduction. 4/5 strength with hip flexion bilaterally.  Neuro/Vasc: Intact distally. +2 patellar reflexes bilaterally Special Testing: Positive Trendelenburg test on the RT. Negative FABER bilaterally. Negative log roll bilaterally. Negative Faber bilaterally.   Assessment & Plan:  Breanna Williams is a 78 yo F with hx of RT greater trochanteric bursitis presenting for 75-month hx of RT low back pain  1. RT low back pain: Patient describes 70-month hx of pain and weakness with walking and getting out of the car. Exam shows TTP along iliac crest and positive Trendelenburg on the RT. No bony process TTP of the lumbar spine and no radicular pain. Findings consistent with weakness of the gluteus medius and persistent greater trochanter pain syndrome. Given hx of LT radicular pain and patient's hx of osteoporosis per DEXA scan in 2023, will order lumbar spine and RT hip/pelvic X-rays to evaluate contribution of degenerative changes. -Ordered X-rays of lumbar spine, RT hip (AP/lateral) and pelvis -Ordered 6-day prednisone taper -Discussed  at-home vs formal PT. Formal PT referral sent today for gluteus medius and greater trochanter pain syndrome. -F/u in 4-6 weeks  Unknown Garbe, [facility]

## 2024-05-19 NOTE — Addendum Note (Signed)
 Addended by: Rodgers Clack on: 05/19/2024 11:41 AM   Modules accepted: Level of Service

## 2024-05-23 DIAGNOSIS — H2511 Age-related nuclear cataract, right eye: Secondary | ICD-10-CM | POA: Diagnosis not present

## 2024-05-23 DIAGNOSIS — Z961 Presence of intraocular lens: Secondary | ICD-10-CM | POA: Diagnosis not present

## 2024-05-23 DIAGNOSIS — H25011 Cortical age-related cataract, right eye: Secondary | ICD-10-CM | POA: Diagnosis not present

## 2024-05-23 DIAGNOSIS — H25041 Posterior subcapsular polar age-related cataract, right eye: Secondary | ICD-10-CM | POA: Diagnosis not present

## 2024-05-23 DIAGNOSIS — H25811 Combined forms of age-related cataract, right eye: Secondary | ICD-10-CM | POA: Diagnosis not present

## 2024-05-24 ENCOUNTER — Ambulatory Visit: Payer: Self-pay | Admitting: Family Medicine

## 2024-05-25 ENCOUNTER — Telehealth: Payer: Self-pay | Admitting: Family Medicine

## 2024-05-25 NOTE — Telephone Encounter (Signed)
 Patient called the office to review xray results.  Does not use MyChart and would like to discuss.  Still feeling the same. Has not yet started PT - was unsure if they would call her or if she should call them.  Explained that the low back x-ray showed that she has scoliosis as well as degenerative/arthritis changes throughout. Hip x-rays show mild arthritis changes as well.  Advise she should proceed with physical therapy as we discussed.  Verified that referral was processed in the system.  Recommend she call [facility] to schedule an appointment.  Should follow-up with me in 6 to 8 weeks if still having issues, sooner as needed.  Patient expressed understanding and agreement, all questions were answered.

## 2024-05-29 NOTE — Progress Notes (Signed)
 Xray results reviewed with patient on phone 05/25/24.  Should proceed with PT as directed and f/u with me in about 6-8 weeks if still having issues.

## 2024-06-08 DIAGNOSIS — M25651 Stiffness of right hip, not elsewhere classified: Secondary | ICD-10-CM | POA: Diagnosis not present

## 2024-06-08 DIAGNOSIS — M25551 Pain in right hip: Secondary | ICD-10-CM | POA: Diagnosis not present

## 2024-06-08 DIAGNOSIS — M25652 Stiffness of left hip, not elsewhere classified: Secondary | ICD-10-CM | POA: Diagnosis not present

## 2024-06-08 DIAGNOSIS — M25552 Pain in left hip: Secondary | ICD-10-CM | POA: Diagnosis not present

## 2024-06-13 DIAGNOSIS — M25552 Pain in left hip: Secondary | ICD-10-CM | POA: Diagnosis not present

## 2024-06-13 DIAGNOSIS — M25651 Stiffness of right hip, not elsewhere classified: Secondary | ICD-10-CM | POA: Diagnosis not present

## 2024-06-13 DIAGNOSIS — M25652 Stiffness of left hip, not elsewhere classified: Secondary | ICD-10-CM | POA: Diagnosis not present

## 2024-06-13 DIAGNOSIS — M25551 Pain in right hip: Secondary | ICD-10-CM | POA: Diagnosis not present

## 2024-06-15 DIAGNOSIS — M25651 Stiffness of right hip, not elsewhere classified: Secondary | ICD-10-CM | POA: Diagnosis not present

## 2024-06-15 DIAGNOSIS — M25551 Pain in right hip: Secondary | ICD-10-CM | POA: Diagnosis not present

## 2024-06-15 DIAGNOSIS — M25552 Pain in left hip: Secondary | ICD-10-CM | POA: Diagnosis not present

## 2024-06-15 DIAGNOSIS — M25652 Stiffness of left hip, not elsewhere classified: Secondary | ICD-10-CM | POA: Diagnosis not present

## 2024-06-20 DIAGNOSIS — M25651 Stiffness of right hip, not elsewhere classified: Secondary | ICD-10-CM | POA: Diagnosis not present

## 2024-06-20 DIAGNOSIS — M25652 Stiffness of left hip, not elsewhere classified: Secondary | ICD-10-CM | POA: Diagnosis not present

## 2024-06-20 DIAGNOSIS — M25551 Pain in right hip: Secondary | ICD-10-CM | POA: Diagnosis not present

## 2024-06-20 DIAGNOSIS — M25552 Pain in left hip: Secondary | ICD-10-CM | POA: Diagnosis not present

## 2024-06-23 DIAGNOSIS — M25652 Stiffness of left hip, not elsewhere classified: Secondary | ICD-10-CM | POA: Diagnosis not present

## 2024-06-23 DIAGNOSIS — M25551 Pain in right hip: Secondary | ICD-10-CM | POA: Diagnosis not present

## 2024-06-23 DIAGNOSIS — M25552 Pain in left hip: Secondary | ICD-10-CM | POA: Diagnosis not present

## 2024-06-23 DIAGNOSIS — M25651 Stiffness of right hip, not elsewhere classified: Secondary | ICD-10-CM | POA: Diagnosis not present

## 2024-07-06 DIAGNOSIS — M25651 Stiffness of right hip, not elsewhere classified: Secondary | ICD-10-CM | POA: Diagnosis not present

## 2024-07-06 DIAGNOSIS — M25652 Stiffness of left hip, not elsewhere classified: Secondary | ICD-10-CM | POA: Diagnosis not present

## 2024-07-06 DIAGNOSIS — M25552 Pain in left hip: Secondary | ICD-10-CM | POA: Diagnosis not present

## 2024-07-06 DIAGNOSIS — M25551 Pain in right hip: Secondary | ICD-10-CM | POA: Diagnosis not present

## 2024-07-19 DIAGNOSIS — M25551 Pain in right hip: Secondary | ICD-10-CM | POA: Diagnosis not present

## 2024-07-19 DIAGNOSIS — M25552 Pain in left hip: Secondary | ICD-10-CM | POA: Diagnosis not present

## 2024-07-19 DIAGNOSIS — M25651 Stiffness of right hip, not elsewhere classified: Secondary | ICD-10-CM | POA: Diagnosis not present

## 2024-07-19 DIAGNOSIS — M25652 Stiffness of left hip, not elsewhere classified: Secondary | ICD-10-CM | POA: Diagnosis not present

## 2024-07-24 DIAGNOSIS — M25652 Stiffness of left hip, not elsewhere classified: Secondary | ICD-10-CM | POA: Diagnosis not present

## 2024-07-24 DIAGNOSIS — M25551 Pain in right hip: Secondary | ICD-10-CM | POA: Diagnosis not present

## 2024-07-24 DIAGNOSIS — M25651 Stiffness of right hip, not elsewhere classified: Secondary | ICD-10-CM | POA: Diagnosis not present

## 2024-07-24 DIAGNOSIS — M25552 Pain in left hip: Secondary | ICD-10-CM | POA: Diagnosis not present

## 2024-07-28 DIAGNOSIS — C44329 Squamous cell carcinoma of skin of other parts of face: Secondary | ICD-10-CM | POA: Diagnosis not present

## 2024-07-28 DIAGNOSIS — D0439 Carcinoma in situ of skin of other parts of face: Secondary | ICD-10-CM | POA: Diagnosis not present

## 2024-08-21 DIAGNOSIS — M25551 Pain in right hip: Secondary | ICD-10-CM | POA: Diagnosis not present

## 2024-08-21 DIAGNOSIS — M25552 Pain in left hip: Secondary | ICD-10-CM | POA: Diagnosis not present

## 2024-08-21 DIAGNOSIS — M25652 Stiffness of left hip, not elsewhere classified: Secondary | ICD-10-CM | POA: Diagnosis not present

## 2024-08-21 DIAGNOSIS — M25651 Stiffness of right hip, not elsewhere classified: Secondary | ICD-10-CM | POA: Diagnosis not present

## 2024-08-24 ENCOUNTER — Ambulatory Visit: Admitting: Family Medicine

## 2024-08-24 VITALS — BP 124/70 | HR 64 | Ht 64.0 in | Wt 138.6 lb

## 2024-08-24 DIAGNOSIS — M545 Low back pain, unspecified: Secondary | ICD-10-CM

## 2024-08-24 NOTE — Progress Notes (Signed)
   I, Claretha Schimke am a scribe for Dr. Artist Lloyd, MD.  Breanna Williams is a 78 y.o. female who presents to [facility] today for ongoing chronic LBP, worsening over since March 2025?[name] locates pain to closer to the tailbone. Had PT, xrays, tens units and other things. Pain is only when she is standing and walking. Would like to return to playing pickleball. Would like to know if there other things she can try like injections. Using the rest room can flare her pain up as well. Her goal is to be pain free most of the time if not all of the time.   Pt was seen in May for this issue by Dr. Teressa.  Radiating pain:yes but not on right side LE numbness/tingling: no LE weakness: yes Aggravates: standing and walking Treatments tried: previous ESI, HEP, Pilates, heating pad, PT, prednisone   Dx testing: 05/18/24 L-spine XR  10/13/22 DEXA  Pertinent review of systems: No fevers or chills  Relevant historical information: Scoliosis, Sciatica on left side, IBS, Raynauds  Exam:  BP 124/70   Pulse 64   Ht 5' 4 (1.626 m)   Wt 138 lb 9.6 oz (62.9 kg)   BMI 23.79 kg/m  General: Well Developed, well nourished, and in no acute distress.   MSK: L-spine decreased lumbar motion pain with extension.  Lower extremity strength is intact.    Lab and Radiology Results  EXAM: LUMBAR SPINE - 2-3 VIEW   COMPARISON:  None Available.   FINDINGS: There is no evidence of lumbar spine fracture. Marked scoliosis of spine. Multi level narrow intervertebral spaces, anterior spurring and facet joint sclerosis noted throughout the lumbar spine. Grade 1 anterolisthesis of L5 on S1.   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     Electronically Signed   By: Craig Farr M.D.   On: 05/23/2024 12:45 I, Artist Lloyd, personally (independently) visualized and performed the interpretation of the images attached in this note.     Assessment and  Plan: 78 y.o. female with chronic low back pain.  This is an ongoing issue.  She does have scoliosis and significant degenerative changes on lumbar spine x-ray from May of this year.  Symptoms most consistent with spinal stenosis.  Plan for MRI lumbar spine for injection planning.  Anticipate referral to pain management for injection management once we get results of MRI back.   PDMP not reviewed this encounter. Orders Placed This Encounter  Procedures   MR Lumbar Spine Wo Contrast    Standing Status:   Future    Expiration Date:   08/24/2025    What is the patient's sedation requirement?:   No Sedation    Does the patient have a pacemaker or implanted devices?:   No    Preferred imaging location?:   [facility] (table limit-550lbs)   No orders of the defined types were placed in this encounter.    Discussed warning signs or symptoms. Please see discharge instructions. Patient expresses understanding.   The above documentation has been reviewed and is accurate and complete Artist Lloyd, M.D.

## 2024-08-24 NOTE — Patient Instructions (Signed)
 Thank you for coming in today. MRI to [facility]. We will contact you with the results when ready.

## 2024-09-03 ENCOUNTER — Ambulatory Visit
Admission: RE | Admit: 2024-09-03 | Discharge: 2024-09-03 | Disposition: A | Discharge status: Home / Self Care | Source: Ambulatory Visit | Attending: Family Medicine | Admitting: Family Medicine

## 2024-09-03 DIAGNOSIS — M545 Low back pain, unspecified: Secondary | ICD-10-CM

## 2024-09-03 DIAGNOSIS — M47816 Spondylosis without myelopathy or radiculopathy, lumbar region: Secondary | ICD-10-CM | POA: Diagnosis not present

## 2024-09-03 DIAGNOSIS — M48061 Spinal stenosis, lumbar region without neurogenic claudication: Secondary | ICD-10-CM | POA: Diagnosis not present

## 2024-09-11 ENCOUNTER — Telehealth: Payer: Self-pay | Admitting: Family Medicine

## 2024-09-11 NOTE — Telephone Encounter (Signed)
 Patient called to check to see if her MRI results have been read yet, she would also like for someone to call once those are in. Berry.

## 2024-09-12 ENCOUNTER — Ambulatory Visit: Payer: Self-pay | Admitting: Family Medicine

## 2024-09-12 DIAGNOSIS — M545 Low back pain, unspecified: Secondary | ICD-10-CM

## 2024-09-12 NOTE — Telephone Encounter (Signed)
 Artist GORMAN Lloyd, MD 09/12/2024  7:19 AM EDT     MRI does show areas of arthritis in your back that could cause back pain and potentially pinched nerves which would cause pain going down your legs.  Based on our conversation at the last visit I will refer you to a pain management doctor they can do back injections.  You should hear soon about scheduling.  Please let me know if you have any questions or if the first person I send you to is not the right person for you.

## 2024-09-12 NOTE — Telephone Encounter (Signed)
 Clarita CHRISTELLA Lora to [facility] (supporting Artist GORMAN Lloyd, MD)     09/12/24  9:18 AM I would like my appointment to be with Dr Darlis. Thank you

## 2024-09-12 NOTE — Progress Notes (Signed)
 MRI does show areas of arthritis in your back that could cause back pain and potentially pinched nerves which would cause pain going down your legs.  Based on our conversation at the last visit I will refer you to a pain management doctor they can do back injections.  You should hear soon about scheduling.  Please let me know if you have any questions or if the first person I send you to is not the right person for you.

## 2024-09-22 DIAGNOSIS — M48062 Spinal stenosis, lumbar region with neurogenic claudication: Secondary | ICD-10-CM | POA: Diagnosis not present

## 2024-10-05 DIAGNOSIS — M48062 Spinal stenosis, lumbar region with neurogenic claudication: Secondary | ICD-10-CM | POA: Diagnosis not present

## 2024-10-19 ENCOUNTER — Other Ambulatory Visit (HOSPITAL_BASED_OUTPATIENT_CLINIC_OR_DEPARTMENT_OTHER): Payer: Self-pay | Admitting: Family Medicine

## 2024-10-19 DIAGNOSIS — M810 Age-related osteoporosis without current pathological fracture: Secondary | ICD-10-CM

## 2024-10-31 ENCOUNTER — Other Ambulatory Visit: Payer: Self-pay | Admitting: Family Medicine

## 2024-10-31 DIAGNOSIS — Z1231 Encounter for screening mammogram for malignant neoplasm of breast: Secondary | ICD-10-CM

## 2024-11-02 DIAGNOSIS — E871 Hypo-osmolality and hyponatremia: Secondary | ICD-10-CM | POA: Diagnosis not present

## 2024-11-21 ENCOUNTER — Ambulatory Visit
Admission: RE | Admit: 2024-11-21 | Discharge: 2024-11-21 | Disposition: A | Source: Ambulatory Visit | Attending: Family Medicine

## 2024-11-21 DIAGNOSIS — Z1231 Encounter for screening mammogram for malignant neoplasm of breast: Secondary | ICD-10-CM

## 2025-01-25 ENCOUNTER — Other Ambulatory Visit: Payer: Self-pay | Admitting: Gastroenterology

## 2025-01-25 ENCOUNTER — Ambulatory Visit
Admission: RE | Admit: 2025-01-25 | Discharge: 2025-01-25 | Disposition: A | Discharge status: Home / Self Care | Source: Ambulatory Visit | Attending: Gastroenterology | Admitting: Gastroenterology

## 2025-01-25 DIAGNOSIS — R32 Unspecified urinary incontinence: Secondary | ICD-10-CM

## 2025-03-16 ENCOUNTER — Ambulatory Visit: Admitting: Physical Therapy
